# Patient Record
Sex: Female | Born: 1960
Health system: Southern US, Community
[De-identification: ages and names within clinical notes are randomized; demographics above are authoritative.]

## PROBLEM LIST (undated history)

## (undated) DIAGNOSIS — E079 Disorder of thyroid, unspecified: Secondary | ICD-10-CM

## (undated) DIAGNOSIS — F419 Anxiety disorder, unspecified: Secondary | ICD-10-CM

## (undated) DIAGNOSIS — T4145XA Adverse effect of unspecified anesthetic, initial encounter: Secondary | ICD-10-CM

## (undated) DIAGNOSIS — M545 Low back pain, unspecified: Secondary | ICD-10-CM

## (undated) DIAGNOSIS — R011 Cardiac murmur, unspecified: Secondary | ICD-10-CM

## (undated) DIAGNOSIS — J180 Bronchopneumonia, unspecified organism: Secondary | ICD-10-CM

## (undated) DIAGNOSIS — E039 Hypothyroidism, unspecified: Secondary | ICD-10-CM

## (undated) DIAGNOSIS — J42 Unspecified chronic bronchitis: Secondary | ICD-10-CM

## (undated) DIAGNOSIS — M5020 Other cervical disc displacement, unspecified cervical region: Secondary | ICD-10-CM

## (undated) DIAGNOSIS — F410 Panic disorder [episodic paroxysmal anxiety] without agoraphobia: Secondary | ICD-10-CM

## (undated) DIAGNOSIS — R0602 Shortness of breath: Secondary | ICD-10-CM

## (undated) DIAGNOSIS — I10 Essential (primary) hypertension: Secondary | ICD-10-CM

## (undated) DIAGNOSIS — G43909 Migraine, unspecified, not intractable, without status migrainosus: Secondary | ICD-10-CM

## (undated) DIAGNOSIS — J189 Pneumonia, unspecified organism: Secondary | ICD-10-CM

## (undated) DIAGNOSIS — M1990 Unspecified osteoarthritis, unspecified site: Secondary | ICD-10-CM

## (undated) DIAGNOSIS — Z87898 Personal history of other specified conditions: Secondary | ICD-10-CM

## (undated) DIAGNOSIS — G8929 Other chronic pain: Secondary | ICD-10-CM

## (undated) DIAGNOSIS — F329 Major depressive disorder, single episode, unspecified: Secondary | ICD-10-CM

## (undated) DIAGNOSIS — J302 Other seasonal allergic rhinitis: Secondary | ICD-10-CM

## (undated) DIAGNOSIS — F32A Depression, unspecified: Secondary | ICD-10-CM

## (undated) HISTORY — DX: Other cervical disc displacement, unspecified cervical region: M50.20

## (undated) HISTORY — DX: Disorder of thyroid, unspecified: E07.9

## (undated) HISTORY — PX: BACK SURGERY: SHX140

## (undated) HISTORY — DX: Panic disorder (episodic paroxysmal anxiety): F41.0

## (undated) HISTORY — DX: Cardiac murmur, unspecified: R01.1

## (undated) HISTORY — DX: Unspecified osteoarthritis, unspecified site: M19.90

## (undated) HISTORY — DX: Personal history of other specified conditions: Z87.898

---
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
## 1997-08-22 ENCOUNTER — Other Ambulatory Visit: Admission: RE | Admit: 1997-08-22 | Discharge: 1997-08-22 | Payer: Self-pay | Admitting: Gynecology

## 1997-09-06 ENCOUNTER — Other Ambulatory Visit: Admission: RE | Admit: 1997-09-06 | Discharge: 1997-09-06 | Payer: Self-pay | Admitting: Gynecology

## 1997-12-19 ENCOUNTER — Other Ambulatory Visit: Admission: RE | Admit: 1997-12-19 | Discharge: 1997-12-19 | Payer: Self-pay | Admitting: Gynecology

## 1998-05-12 DIAGNOSIS — T8859XA Other complications of anesthesia, initial encounter: Secondary | ICD-10-CM

## 1998-05-12 HISTORY — DX: Other complications of anesthesia, initial encounter: T88.59XA

## 1999-01-30 ENCOUNTER — Other Ambulatory Visit: Admission: RE | Admit: 1999-01-30 | Discharge: 1999-01-30 | Payer: Self-pay | Admitting: Gynecology

## 2004-05-12 DIAGNOSIS — Z87898 Personal history of other specified conditions: Secondary | ICD-10-CM

## 2004-05-12 HISTORY — DX: Personal history of other specified conditions: Z87.898

## 2004-05-12 HISTORY — PX: LIPOSUCTION: SHX10

## 2005-05-30 ENCOUNTER — Other Ambulatory Visit: Admission: RE | Admit: 2005-05-30 | Discharge: 2005-05-30 | Payer: Self-pay | Admitting: Family Medicine

## 2005-06-16 ENCOUNTER — Encounter: Admission: RE | Admit: 2005-06-16 | Discharge: 2005-06-16 | Payer: Self-pay | Admitting: Family Medicine

## 2005-06-30 ENCOUNTER — Inpatient Hospital Stay (HOSPITAL_COMMUNITY): Admission: EM | Admit: 2005-06-30 | Discharge: 2005-07-06 | Payer: Self-pay | Admitting: Emergency Medicine

## 2005-07-11 ENCOUNTER — Ambulatory Visit (HOSPITAL_COMMUNITY): Admission: RE | Admit: 2005-07-11 | Discharge: 2005-07-11 | Payer: Self-pay | Admitting: Family Medicine

## 2006-03-30 ENCOUNTER — Other Ambulatory Visit: Admission: RE | Admit: 2006-03-30 | Discharge: 2006-03-30 | Payer: Self-pay | Admitting: Family Medicine

## 2007-04-12 ENCOUNTER — Other Ambulatory Visit: Admission: RE | Admit: 2007-04-12 | Discharge: 2007-04-12 | Payer: Self-pay | Admitting: Family Medicine

## 2007-07-19 ENCOUNTER — Emergency Department (HOSPITAL_COMMUNITY): Admission: EM | Admit: 2007-07-19 | Discharge: 2007-07-19 | Payer: Self-pay | Admitting: Emergency Medicine

## 2008-04-20 ENCOUNTER — Other Ambulatory Visit: Admission: RE | Admit: 2008-04-20 | Discharge: 2008-04-20 | Payer: Self-pay | Admitting: Family Medicine

## 2008-09-22 ENCOUNTER — Encounter: Admission: RE | Admit: 2008-09-22 | Discharge: 2008-09-22 | Payer: Self-pay | Admitting: Family Medicine

## 2009-10-01 LAB — HM PAP SMEAR

## 2009-10-10 HISTORY — PX: ENDOMETRIAL BIOPSY: SHX622

## 2010-02-09 HISTORY — PX: ABDOMINAL HYSTERECTOMY: SHX81

## 2010-02-26 ENCOUNTER — Ambulatory Visit (HOSPITAL_COMMUNITY): Admission: RE | Admit: 2010-02-26 | Discharge: 2010-02-26 | Payer: Self-pay | Admitting: Obstetrics and Gynecology

## 2010-07-25 LAB — COMPREHENSIVE METABOLIC PANEL
ALT: 15 U/L (ref 0–35)
Alkaline Phosphatase: 67 U/L (ref 39–117)
Creatinine, Ser: 0.61 mg/dL (ref 0.4–1.2)
GFR calc Af Amer: >60 mL/min (ref >60)
Sodium: 140 mEq/L (ref 135–145)
Total Bilirubin: 0.4 mg/dL (ref 0.3–1.2)

## 2010-07-25 LAB — CBC
HCT: 45.5 % (ref 36.0–46.0)
Hemoglobin: 15.7 g/dL — ABNORMAL HIGH (ref 12.0–15.0)
MCHC: 34.5 g/dL (ref 30.0–36.0)
MCV: 86.9 fL (ref 78.0–100.0)
Platelets: 242 10*3/uL (ref 150–400)
RBC: 5.24 MIL/uL — ABNORMAL HIGH (ref 3.87–5.11)
RDW: 12.8 % (ref 11.5–15.5)

## 2010-09-27 NOTE — H&P (Signed)
[name], [name]        ACCOUNT NO.:  [id number]   MEDICAL RECORD NO.:  [id number]          PATIENT TYPE:  EMS   LOCATION:  ED                           FACILITY:  [facility]   PHYSICIAN:  Kela Millin, M.D.DATE OF BIRTH:  1961/02/03   DATE OF ADMISSION:  06/30/2005  DATE OF DISCHARGE:                                HISTORY & PHYSICAL   PRIMARY CARE PHYSICIAN:  Robert L. Foy Guadalajara, M.D.   CHIEF COMPLAINT:  Hypoxia, status post liposuction.   HISTORY OF PRESENT ILLNESS:  The patient is a 50 year old white female with  past medical history significant for asthma, depression, who was intubated  for liposuction today. Following extubation, she was noted to be hypoxic.  She was given Lasix at the office and transferred to [facility] ER for  further evaluation. The patient states that she has had rhinorrhea for the  past 3 weeks or so and she attributed that to her allergies. She also admits  to a cough productive of clear sputum that is unchanged from what she  usually has with her asthma. She denies fever, chest pain, dysuria, nausea,  vomiting, hematemesis, hemoptysis, diarrhea, melena, and no hematemesis.   The patient was seen in the emergency room and a chest x-ray showed patchy  air space densities bilaterally and confluent in the perihilar regions and  in the dependent medial lower lobe. The patient also had a CT angiogram,  which showed bilateral lower lobe infiltrates, negative for pulmonary  emboli. Her white cell count was noted to be 19.6 and her O2 sat on  supplemental oxygen was 96%. She is admitted to the [facility] for further evaluation and management.   PAST MEDICAL HISTORY:  1.  As stated above.  2.  History of migraine headaches.  3.  History of a heart murmur.   MEDICATIONS:  1.  Flonase.  2.  Advair 250/50 b.i.d.  3.  Epinephrine 1:1,000.  4.  Albuterol MDI.  5.  Lexapro 20 mg daily.  6.  Singulair 10 mg daily.  7.  Reglan.  8.   Birth control pill.  9.  Allegra.   ALLERGIES:  BACTRIM.   SOCIAL HISTORY:  She denies tobacco. Occasional alcohol.   FAMILY HISTORY:  Her mother had lung and brain cancer. Her dad had bladder  cancer.   REVIEW OF SYSTEMS:  As per HPI. Other review of systems negative.   PHYSICAL EXAMINATION:  GENERAL:  The patient is a middle aged white female.  She has a head/face band dressing. She is in no respiratory distress with  nasal cannula O2 on.  VITAL SIGNS:  Temperature 96.8, blood pressure 138/82, initially 163/92.  Pulse 112. Respiratory rate 24. O2 sat 96% on nasal cannula O2.  HEENT:  Pupils are equal, round, and reactive to light. Extraocular muscles  intact. Sclerae anicteric. Moist mucous membranes.  LUNGS:  A few basilar crackles, no wheezes.  CARDIOVASCULAR:  Regular, normal S1 and S2.  ABDOMEN:  She has a corset dressing from her extremities up to her abdomen.  Bowel sounds present.  EXTREMITIES:  She has PAS hose as well as a  corset dressing that extends up  to her abdomen. No edema.  NEUROLOGIC:  Alert and oriented. Cranial nerves 2-12 are grossly intact,  nonfocal examination.   LABORATORY DATA:  Chest x-ray:  Patchy air space densities bilaterally,  confluent in the perihilar regions and in the dependent medial lower lobes.   A CT angiogram:  Bilateral lower lobe infiltrates.   White cell count 19.6 with hemoglobin of 13.5 and hematocrit 39.2. Platelet  count 279,000. Neutrophil count 97%, pH 7.32, pCO2 42.5, pO2 73, and the O2  sat is 93.8% and this is on 1 liter O2. Sodium 136, potassium 3.8, chloride  109, CO2 22, glucose 235, BUN 12, creatinine 0.6, calcium 7.6. AST 11, ALT  13, alkaline phosphatase 49, total bilirubin 0.2. Point of care markers  negative x1.   ASSESSMENT/PLAN:  1.  Bilateral infiltrates, probable aspiration pneumonia. As already      discussed, the patient is status post extubation following liposuction      procedure. She has a white cell  count of 19,000. Will start empiric      antibiotics with Zosyn to cover for possible aspirationpneeumonia,      expectorants. Will continue bronchodilators.  2.  Status post liposuction. States she had liposuction to her chin/neck      area, abdomen, and thighs. Discussed patient with plastic surgeon, Dr.      Charolotte Eke and he is to follow patient in the morning.  3.  History of asthma. Continue outpatient medications.  4.  History of depression. Continue Lexapro.      Kela Millin, M.D.  Electronically Signed     ACV/MEDQ  D:  06/30/2005  T:  06/30/2005  Job:  [id number]   cc:   Molly Maduro L. Foy Gu[place]ara, M.D.  Fax: [phone]   L. Doristine Section, M.D.  Fax: [phone]

## 2010-09-27 NOTE — Discharge Summary (Signed)
[name], [name]        ACCOUNT NO.:  [id number]   MEDICAL RECORD NO.:  [id number]          PATIENT TYPE:  INP   LOCATION:  1604                         FACILITY:  [facility]   PHYSICIAN:  Sherin Quarry, MD      DATE OF BIRTH:  Dec 23, 1960   DATE OF ADMISSION:  06/30/2005  DATE OF DISCHARGE:                                 DISCHARGE SUMMARY   [name] is a 50 year old lady with a past history of asthma and  depression who underwent liposuction on February 19. After being extubated  after the procedure, she was noted to be hypoxic and was therefore sent to  the [facility] emergency room for further evaluation. In the emergency  room, a chest x-ray was obtained which showed patchy airspace densities  bilaterally which appeared to be confluent in perihilar region. A CT  utilizing PE protocol was done which had the same finding. It was negative  for pulmonary emboli. White count was 19,600. It was suspected that possibly  the patient had experienced an aspiration event. Another possible  explanation that was entertained was fat emboli.   PHYSICAL EXAMINATION:  Physical exam at time of admission as described by  Dr. Kela Millin.  VITAL SIGNS:  Temperature is 96.8, blood pressure 138/82, pulse was 112,  respirations 24, O2 saturation was 96% on 2 liters by nasal cannula.  HEENT:  Exam was within normal limits.  CHEST:  The chest revealed a few bibasilar crackles.  CARDIOVASCULAR:  Normal S1, S2 without rubs, murmurs or gallops.  ABDOMEN:  The abdomen was benign. The patient had a corset dressing in  place. There was no guarding or rebound.  NEUROLOGIC TESTING:  The patient was alert and oriented. Cranial nerves,  motor, sensory and cerebellar testing was normal.  EXTREMITIES:  Examination of extremities revealed no evidence of cyanosis or  edema.   LABORATORY DATA:  Relevant laboratory studies obtained included serial  cardiac enzymes which were negative. Blood  cultures were negative. BNP was  less then 30. Liver profile was normal. Sodium was 136, potassium 3.8. The  patient's glucose was initially noted to be 235. Subsequently her glucose  was monitored and was within the normal range. Creatinine was 0.6, BUN was  12. White count was 14,300 as mentioned, hemoglobin was 11.5. On admission  Dr. Suanne Marker who placed the patient on a clear liquid diet. She was placed on  IV Zosyn 3.375 grams every 6 hours. Dilaudid was administered for pain. She  was continued on her Advair therapy as well as albuterol metered dose  inhaler, Singulair 10 mg daily was also continued. The patient's course was  one of slow gradual improvement. Her temperature was 100 on February 22.  Temperature curve showed gradual defervescence after that point. She was  advanced to a regular diet. Her oxygen saturation on room air gradually came  up to the point where it was 96% on February 25. She was observed to be able  to ambulate without difficulty and had only a mild dry cough. Dr.  Charolotte Eke followed the patient regularly in the hospital and expressed  satisfaction with her postoperative recovery.  By February 25, the patient  was feeling absolutely fine, she was no longer on oxygen. She had only a  mild dry cough. She did not complain of chest pain and was able to ambulate  up and down the halls without any difficulty. Therefore she was discharged.   DISCHARGE DIAGNOSES:  1.  Acute onset of bilateral pulmonary infiltrates possibly secondary to      aspiration.  2.  Status post liposuction procedure.  3.  History of asthma.  4.  History of depression.  5.  History of migraine headaches.   The patient was advised to continue her usual medicines. These apparently  consist of :  1.  Flonase p.r.n.  2.  Advair 50/250 1 puff b.i.d.  3.  Albuterol meter dose inhaler 3 puffs q.i.d.  4.  Lexapro 20 mg daily.  5.  Singulair 10 mg daily.  6.  Reglan p.r.n.  7.  Birth control  pill.  8.  Allegra also p.r.n.   In addition of these medicines, she is instructed to continue Avelox for 4  additional days to complete a total of 10 days of antibiotic therapy. As per  Dr. Neena Rhymes instructions, she is also advised to take an iron tablet  twice daily which he supplied her previously. She will be given Vicodin  5/500 with instructions take 1-2 every 6 hours p.r.n. for pain. She was  advised to call Dr. Pablo Lawrence office on Monday to arrange a return visit  possibly Thursday or Friday. She will eventually need to have a chest x-ray  to confirm resolution of the pulmonary infiltrates. This should probably be  done in about 4-6 weeks. She will also follow-up with Dr. Charolotte Eke on  Wednesday.           ______________________________  Sherin Quarry, MD     SY/MEDQ  D:  07/06/2005  T:  07/07/2005  Job:  [id number]   cc:   Molly Maduro L. Foy [place], M.D.  Fax: [phone]   L. Doristine Section, M.D.  Fax: [phone]

## 2011-05-04 ENCOUNTER — Emergency Department (INDEPENDENT_AMBULATORY_CARE_PROVIDER_SITE_OTHER): Payer: 59

## 2011-05-04 ENCOUNTER — Emergency Department (HOSPITAL_BASED_OUTPATIENT_CLINIC_OR_DEPARTMENT_OTHER)
Admission: EM | Admit: 2011-05-04 | Discharge: 2011-05-04 | Disposition: A | Discharge status: Home / Self Care | Payer: 59 | Attending: Emergency Medicine | Admitting: Emergency Medicine

## 2011-05-04 ENCOUNTER — Encounter: Payer: Self-pay | Admitting: *Deleted

## 2011-05-04 DIAGNOSIS — I10 Essential (primary) hypertension: Secondary | ICD-10-CM | POA: Insufficient documentation

## 2011-05-04 DIAGNOSIS — R509 Fever, unspecified: Secondary | ICD-10-CM

## 2011-05-04 DIAGNOSIS — Z79899 Other long term (current) drug therapy: Secondary | ICD-10-CM | POA: Insufficient documentation

## 2011-05-04 DIAGNOSIS — R112 Nausea with vomiting, unspecified: Secondary | ICD-10-CM

## 2011-05-04 DIAGNOSIS — R059 Cough, unspecified: Secondary | ICD-10-CM

## 2011-05-04 DIAGNOSIS — J45909 Unspecified asthma, uncomplicated: Secondary | ICD-10-CM | POA: Insufficient documentation

## 2011-05-04 DIAGNOSIS — R05 Cough: Secondary | ICD-10-CM

## 2011-05-04 HISTORY — DX: Essential (primary) hypertension: I10

## 2011-05-04 HISTORY — DX: Other seasonal allergic rhinitis: J30.2

## 2011-05-04 HISTORY — DX: Depression, unspecified: F32.A

## 2011-05-04 HISTORY — DX: Anxiety disorder, unspecified: F41.9

## 2011-05-04 HISTORY — DX: Major depressive disorder, single episode, unspecified: F32.9

## 2011-05-04 LAB — RAPID STREP SCREEN (MED CTR MEBANE ONLY): Streptococcus, Group A Screen (Direct): NEGATIVE

## 2011-05-04 MED ORDER — HYDROCODONE-ACETAMINOPHEN 5-325 MG PO TABS
ORAL_TABLET | ORAL | Status: AC
  Administered 2011-05-04: 2 via ORAL
  Filled 2011-05-04: qty 2

## 2011-05-04 MED ORDER — HYDROCODONE-ACETAMINOPHEN 5-325 MG PO TABS
2.0000 | ORAL_TABLET | Freq: Once | ORAL | Status: AC
  Administered 2011-05-04: 2 via ORAL

## 2011-05-04 MED ORDER — IBUPROFEN 800 MG PO TABS
800.0000 mg | ORAL_TABLET | Freq: Once | ORAL | Status: AC
  Administered 2011-05-04: 800 mg via ORAL
  Filled 2011-05-04: qty 1

## 2011-05-04 MED ORDER — HYDROCODONE-ACETAMINOPHEN 5-325 MG PO TABS: 2.0000 | ORAL_TABLET | ORAL | Status: AC | PRN

## 2011-05-04 MED ORDER — IBUPROFEN 800 MG PO TABS
ORAL_TABLET | ORAL | Status: AC
  Filled 2011-05-04: qty 1

## 2011-05-04 NOTE — ED Provider Notes (Signed)
History   This chart was scribed for Doug Sou, MD by Sofie Rower. The patient was seen in room MH07/MH07 and the patient's care was started at 5:30PM .    CSN: [id number]  Arrival date & time 05/04/11  1512   First MD Initiated Contact with Patient 05/04/11 1724      Chief Complaint  Patient presents with  . Influenza    (Consider location/radiation/quality/duration/timing/severity/associated sxs/prior treatment) HPI  [name] is a 50 y.o. female who presents to the Emergency Department complaining of moderate, constant cough onset five days ago with associated symptoms of myalgia, sore throat, vomiting, and loss of sleep. . Pt. Has had sick contacts at home and bronchitis a month ago. Pt. Has a history of asthma and arthritis. Pt. Has been treating symptoms at home with day-quil and alevePt also complains of difficulty breathing. Pt denies smoking and illegal drug use.  Past Medical History  Diagnosis Date  . Asthma   . Hypertension   . Anxiety   . Depression   . Seasonal allergies     Past Surgical History  Procedure Date  . Abdominal hysterectomy     No family history on file.  History  Substance Use Topics  . Smoking status: Never Smoker   . Smokeless tobacco: Not on file  . Alcohol Use: Yes    OB History    Grav Para Term Preterm Abortions TAB SAB Ect Mult Living                  Review of Systems  10 Systems reviewed and are negative for acute change except as noted in the HPI.   Allergies  Bactrim and Other  Home Medications   Current Outpatient Rx  Name Route Sig Dispense Refill  . BISOPROLOL FUMARATE 5 MG PO TABS Oral Take 5 mg by mouth daily.      . BUDESONIDE-FORMOTEROL FUMARATE 160-4.5 MCG/ACT IN AERO Inhalation Inhale 2 puffs into the lungs 2 (two) times daily.      Marland Kitchen CETIRIZINE HCL 10 MG PO TABS Oral Take 10 mg by mouth daily.      . DULOXETINE HCL 60 MG PO CPEP Oral Take 60 mg by mouth daily.      Marland Kitchen LEVOTHYROXINE  SODIUM 75 MCG PO TABS Oral Take 75 mcg by mouth daily.      Marland Kitchen MENTHOL 3 MG MT LOZG Oral Take 1 lozenge by mouth as needed.      Marland Kitchen MINOCYCLINE HCL ER 80 MG PO TB24 Oral Take 80 mg by mouth daily.      . MOMETASONE FUROATE 50 MCG/ACT NA SUSP Nasal Place 1 spray into the nose daily.     . OSELTAMIVIR PHOSPHATE 75 MG PO CAPS Oral Take 75 mg by mouth 2 (two) times daily.      Marland Kitchen PROBIOTIC FORMULA PO CAPS Oral Take 1 capsule by mouth daily.      . TRAZODONE HCL 50 MG PO TABS Oral Take 50-100 mg by mouth at bedtime.        BP 115/84  Pulse 86  Temp(Src) 100.8 F (38.2 C) (Oral)  Resp 20  Ht 5\' 11"  (1.803 m)  Wt 205 lb (92.987 kg)  BMI 28.59 kg/m2  SpO2 95%  Physical Exam  Nursing note and vitals reviewed. Constitutional: She is oriented to person, place, and time. She appears well-developed and well-nourished. No distress.  HENT:  Head: Normocephalic and atraumatic.       Oropharynx red.  Eyes: EOM are normal. Pupils are equal, round, and reactive to light.  Neck: Neck supple. No tracheal deviation present.  Cardiovascular: Normal rate and regular rhythm.   Pulmonary/Chest: Effort normal. No respiratory distress.  Abdominal: Soft. She exhibits no distension.  Musculoskeletal: Normal range of motion. She exhibits no edema.  Neurological: She is alert and oriented to person, place, and time. No sensory deficit.  Skin: Skin is warm and dry.  Psychiatric: She has a normal mood and affect. Her behavior is normal.    ED Course  Procedures (including critical care time)  DIAGNOSTIC STUDIES: Oxygen Saturation is 95% on room air, normal by my interpretation.    COORDINATION OF CARE:    Results for orders placed during the hospital encounter of 05/04/11  RAPID STREP SCREEN      Component Value Range   Streptococcus, Group A Screen (Direct) NEGATIVE  NEGATIVE    Dg Chest 2 View  05/04/2011  *RADIOLOGY REPORT*  Clinical Data: Cough, nausea and vomiting.  CHEST - 2 VIEW  Comparison:  Plain film and CT chest 06/30/2005.  Findings: The lungs are clear.  Heart size is normal.  No pneumothorax or pleural effusion.  No focal bony abnormality.  IMPRESSION: No acute disease.  Original Report Authenticated By: Bernadene Bell. D'ALESSIO, M.D.         MDM  6:45 PM feels much improved after treatment with hydrocodone-A. Pap patient alert talkative no distress. Symptoms consistent with viral illness. Plan prescription hydrocodone-A. Pap followup PMD if not better 4 or 5 days  5:40PM- EDP at bedside discusses treatment plan. Diagnosis febrile illness  I personally performed the services described in this documentation, which was scribed in my presence. The recorded information has been reviewed and considered.      Doug Sou, MD 05/04/11 [phone]

## 2011-05-04 NOTE — ED Notes (Signed)
Pt states her son had the flu and she thought she was getting a "cold" on Tuesday. Started Tamiflu on Friday. Fever all week. Cough, congestion to the point of blood tinged sputum.

## 2011-05-04 NOTE — ED Notes (Signed)
Care and hydration safe use of meds reviewed

## 2012-03-02 LAB — HM MAMMOGRAPHY

## 2012-05-12 DIAGNOSIS — J189 Pneumonia, unspecified organism: Secondary | ICD-10-CM

## 2012-05-12 HISTORY — DX: Pneumonia, unspecified organism: J18.9

## 2013-02-04 ENCOUNTER — Ambulatory Visit: Payer: Self-pay | Admitting: Obstetrics and Gynecology

## 2013-02-07 ENCOUNTER — Ambulatory Visit (INDEPENDENT_AMBULATORY_CARE_PROVIDER_SITE_OTHER): Payer: BC Managed Care – PPO | Admitting: Obstetrics and Gynecology

## 2013-02-07 ENCOUNTER — Telehealth: Payer: Self-pay | Admitting: Obstetrics and Gynecology

## 2013-02-07 ENCOUNTER — Encounter: Payer: Self-pay | Admitting: Obstetrics and Gynecology

## 2013-02-07 VITALS — BP 122/80 | HR 70 | Ht 69.5 in | Wt 185.5 lb

## 2013-02-07 DIAGNOSIS — N393 Stress incontinence (female) (male): Secondary | ICD-10-CM

## 2013-02-07 DIAGNOSIS — Z0000 Encounter for general adult medical examination without abnormal findings: Secondary | ICD-10-CM

## 2013-02-07 DIAGNOSIS — Z1211 Encounter for screening for malignant neoplasm of colon: Secondary | ICD-10-CM

## 2013-02-07 DIAGNOSIS — N812 Incomplete uterovaginal prolapse: Secondary | ICD-10-CM

## 2013-02-07 DIAGNOSIS — Z01419 Encounter for gynecological examination (general) (routine) without abnormal findings: Secondary | ICD-10-CM

## 2013-02-07 LAB — POCT URINALYSIS DIPSTICK
Glucose, UA: NEGATIVE
Leukocytes, UA: NEGATIVE
Nitrite, UA: NEGATIVE
Protein, UA: NEGATIVE
Urobilinogen, UA: NEGATIVE

## 2013-02-07 NOTE — Patient Instructions (Signed)

## 2013-02-07 NOTE — Telephone Encounter (Signed)
LMTCB to discuss ins benefits and schedule Urodynamic testing.

## 2013-02-07 NOTE — Progress Notes (Signed)
Patient ID: [name], female   DOB: 10/31/1960, 52 y.o.   MRN: [id number] GYNECOLOGY VISIT  PCP:  Marinda Elk, MD  Referring provider:   HPI: 52 y.o.   Married  Caucasian  female   No obstetric history on file. with No LMP recorded. Patient has had a hysterectomy.   here for   AEX. Patient having urinary incontinence. Has asthma and so coughing a lot and leaks then. Leaks with sex, laughing, exercise.  Does not leak for no reason at all. NF - depends, can be up to every 30 minutes if drinking a lot of fluids. DF - twice No bladder surgery.  Started incontinence after childbirth.  Largest was 9# 1 ounces.  Wants to pursue treatment.   Having hot flashes and night sweats.  Discomfort with intercourse.  Uses lubricant.   Wearing a pad.  Hgb:  PCP Urine:  Neg  GYNECOLOGIC HISTORY: No LMP recorded. Patient has had a hysterectomy. Sexually active:  yes Partner preference: female Contraception:  hysterectomy Menopausal hormone therapy:  DES exposure:   no Blood transfusions:   no Sexually transmitted diseases:   no GYN Procedures:  Robotic-[facility]--still has ovaries Mammogram:    09/2012 wnl: [facility]             Pap:  10-01-09 wnl History of abnormal pap smear:  Yes, 2006.   OB History   Grav Para Term Preterm Abortions TAB SAB Ect Mult Living                   LIFESTYLE: Exercise:   walking            Tobacco:   no Alcohol:      4 glasses of wine per week Drug use:   no  OTHER HEALTH MAINTENANCE: Tetanus/TDap:  09/2009 Gardisil:  NA Influenza:  01/2012 Zostavax:  NA  Bone density: n/a Colonoscopy: never  Cholesterol check: last week with PCP  Family History  Problem Relation Age of Onset  . Brain cancer Mother     dec with brain & lung CA  . Diabetes Maternal Grandmother   . Diabetes Maternal Grandfather   . Diabetes Paternal Grandmother   . Diabetes Paternal Grandfather   . Bladder Cancer Father     dec age 68     There are no active problems to display for this patient.  Past Medical History  Diagnosis Date  . Asthma   . Hypertension   . Anxiety   . Depression   . Seasonal allergies   . Thyroid disease     hypothyroidism  . Hx of abnormal Pap smear 2006    no treatment to cervix    Past Surgical History  Procedure Laterality Date  . Abdominal hysterectomy  02/2010    Robotic-[facility]--Dr. Tresa Res  . Endometrial biopsy  10-10-09    Benign fragments of endometrial polyp  . Inguinal hernia repair      as an infant  . Liposuction  2006    thighs, chin    ALLERGIES: Bactrim and Other  Current Outpatient Prescriptions  Medication Sig Dispense Refill  . bisoprolol (ZEBETA) 5 MG tablet Take 5 mg by mouth daily.        . budesonide-formoterol (SYMBICORT) 160-4.5 MCG/ACT inhaler Inhale 2 puffs into the lungs 2 (two) times daily.        . cetirizine (ZYRTEC) 10 MG tablet Take 10 mg by mouth daily.        Marland Kitchen  clonazePAM (KLONOPIN) 0.5 MG tablet Take 0.5 mg by mouth as needed for anxiety.      . DULoxetine (CYMBALTA) 60 MG capsule Take 60 mg by mouth daily.        Marland Kitchen levothyroxine (SYNTHROID, LEVOTHROID) 75 MCG tablet Take 75 mcg by mouth daily.        Marland Kitchen menthol-cetylpyridinium (CEPACOL) 3 MG lozenge Take 1 lozenge by mouth as needed.        . Minocycline HCl (SOLODYN) 80 MG TB24 Take 80 mg by mouth daily.        . mometasone (NASONEX) 50 MCG/ACT nasal spray Place 1 spray into the nose daily.       . montelukast (SINGULAIR) 10 MG tablet Take 10 mg by mouth at bedtime.      . Multiple Vitamin (MULTIVITAMIN) capsule Take 1 capsule by mouth daily.      . Probiotic Product (PROBIOTIC FORMULA) CAPS Take 1 capsule by mouth daily.        . traZODone (DESYREL) 50 MG tablet Take 50-100 mg by mouth at bedtime.         No current facility-administered medications for this visit.     ROS:  Pertinent items are noted in HPI.  SOCIAL HISTORY:   Just remarried and patient is very happy.   Husband is 35 years  old.   Patient is a stay at home mom.   PHYSICAL EXAMINATION:    BP 122/80  Pulse 70  Ht 5' 9.5" (1.765 m)  Wt 185 lb 8 oz (84.142 kg)  BMI 27.01 kg/m2   Wt Readings from Last 3 Encounters:  02/07/13 185 lb 8 oz (84.142 kg)  05/04/11 205 lb (92.987 kg)     Ht Readings from Last 3 Encounters:  02/07/13 5' 9.5" (1.765 m)  05/04/11 5\' 11"  (1.803 m)    General appearance: alert, cooperative and appears stated age Head: Normocephalic, without obvious abnormality, atraumatic Neck: no adenopathy, supple, symmetrical, trachea midline and thyroid not enlarged, symmetric, no tenderness/mass/nodules Lungs: clear to auscultation bilaterally Breasts: Right breast with superficial bruise at 4 o'clock.  No induration.  (States was part of recent sexual activity.)  No nipple retraction or dimpling, No nipple discharge or bleeding, No axillary or supraclavicular adenopathy, Normal to palpation without dominant masses Heart: regular rate and rhythm Abdomen: soft, non-tender; no masses,  no organomegaly Extremities: extremities normal, atraumatic, no cyanosis or edema Skin: Skin color, texture, turgor normal. No rashes or lesions Lymph nodes: Cervical, supraclavicular, and axillary nodes normal. No abnormal inguinal nodes palpated Neurologic: Grossly normal  Pelvic: External genitalia:  no lesions              Urethra:  normal appearing urethra with no masses, tenderness or lesions              Bartholins and Skenes: normal                 Vagina: normal appearing vagina with normal color and discharge, no lesions.  First degree cystocele and first degree rectocele.   Good apical support.               Cervix: absent              Pap and high risk HPV testing done: no.            Bimanual Exam:  Uterus:   absent  Adnexa: no masses                                      Rectovaginal: Confirms                                      Anus:  normal sphincter tone, no  lesions  ASSESSMENT  Incomplete vaginal prolapse. Genuine stress incontinence.  Menopausal symptoms.  Declines ERT.  PLAN  Mammogram yearly.  Patient chooses to do with [facility].  Pap smear and high risk HPV testing - not performed.  Counseled on urinary incontinence and prolapse.  ACOG handouts on both of these as well as surgical treatments of the above. Return for urodynamic studies.  Return annually or prn   An After Visit Summary was printed and given to the patient.

## 2013-02-10 NOTE — Telephone Encounter (Signed)
Patient scheduled for 10/15 for urodynamic testing.

## 2013-02-15 ENCOUNTER — Telehealth: Payer: Self-pay | Admitting: Obstetrics and Gynecology

## 2013-02-15 ENCOUNTER — Encounter: Payer: Self-pay | Admitting: Internal Medicine

## 2013-02-15 ENCOUNTER — Other Ambulatory Visit: Payer: Self-pay | Admitting: Obstetrics and Gynecology

## 2013-02-15 DIAGNOSIS — Z1273 Encounter for screening for malignant neoplasm of ovary: Secondary | ICD-10-CM

## 2013-02-15 NOTE — Telephone Encounter (Signed)
Pt would like to speak with nurse about getting an ultrasound appointmnet. She has some concerns about cancer.

## 2013-02-15 NOTE — Telephone Encounter (Signed)
Dr. Edward Jolly,  Spoke with patient. She is requesting an ultrasound to r/o ovarian cancer. Her best friend has been diagnosed and experienced same symptoms that patient has with bladder "dropping" and concerns from prior to hysterectomy. Denies any new symptoms. Would like to have U/S done here in office if possible.

## 2013-02-15 NOTE — Telephone Encounter (Signed)
I will order an in office pelvic ultrasound to evaluate her ovaries.

## 2013-02-16 NOTE — Telephone Encounter (Addendum)
Thank you Dr. Edward Jolly, patient is wondering how long does it usually take to get Urodynamic testing results back? PUS scheduled for the the day after  Urodynamics and was wondering if results would be back by then.

## 2013-02-16 NOTE — Telephone Encounter (Signed)
Hi Tracy,  I should be able to review the urodynamics when patient comes in for her ultrasound appointment.

## 2013-02-17 ENCOUNTER — Telehealth: Payer: Self-pay | Admitting: Obstetrics and Gynecology

## 2013-02-17 NOTE — Telephone Encounter (Signed)
Patient given message from Dr. Edward Jolly. Agreeable to plan.

## 2013-02-17 NOTE — Telephone Encounter (Signed)
Call to patient, LVM, advised patient will be responsible for a $25 copay for her PUS.

## 2013-02-21 ENCOUNTER — Ambulatory Visit (INDEPENDENT_AMBULATORY_CARE_PROVIDER_SITE_OTHER): Payer: BC Managed Care – PPO

## 2013-02-21 VITALS — BP 118/82 | HR 60 | Resp 12 | Ht 69.5 in | Wt 186.0 lb

## 2013-02-21 DIAGNOSIS — R32 Unspecified urinary incontinence: Secondary | ICD-10-CM

## 2013-02-21 LAB — POCT URINALYSIS DIPSTICK
Nitrite, UA: NEGATIVE
Urobilinogen, UA: NEGATIVE
pH, UA: 7

## 2013-02-22 ENCOUNTER — Telehealth: Payer: Self-pay | Admitting: *Deleted

## 2013-02-22 ENCOUNTER — Other Ambulatory Visit: Payer: Self-pay | Admitting: Obstetrics and Gynecology

## 2013-02-22 LAB — URINALYSIS, MICROSCOPIC ONLY

## 2013-02-22 MED ORDER — CIPROFLOXACIN HCL 500 MG PO TABS: 500.0000 mg | ORAL_TABLET | Freq: Two times a day (BID) | ORAL | Status: DC

## 2013-02-22 NOTE — Telephone Encounter (Signed)
Patient returned call, notified of Dr Rica Records instruction. TOC scheduled for 03-07-13 and urodynamics rescheduled for 03-09-13, patient is aware this is pending the TOC. Patient will keep appt for PUS scheduled for this Thursday and is anxious to proceed with scheduling surgery for this year.

## 2013-02-22 NOTE — Telephone Encounter (Signed)
Message copied by Alisa Graff on Tue Feb 22, 2013  2:06 PM ------      Message from: Conley Simmonds      Created: Tue Feb 22, 2013  6:52 AM       Kennon Rounds,            This patient needs to have a urine culture performed from her urine specimen from yesterday.      Please begin treatment with Ciprofloxacin 500 mg po bid for 7 days.  I will sent Rx in through Epic.            Contact the patient regarding this and cancel her urodynamics testing.            She will need a test of cure in 10 days, and we can then see about doing her uro studies.             Thanks,            [employer] ------

## 2013-02-22 NOTE — Telephone Encounter (Signed)
Message copied by Alisa Graff on Tue Feb 22, 2013 12:42 PM ------      Message from: Conley Simmonds      Created: Tue Feb 22, 2013  6:52 AM       Kennon Rounds,            This patient needs to have a urine culture performed from her urine specimen from yesterday.      Please begin treatment with Ciprofloxacin 500 mg po bid for 7 days.  I will sent Rx in through Epic.            Contact the patient regarding this and cancel her urodynamics testing.            She will need a test of cure in 10 days, and we can then see about doing her uro studies.             Thanks,            [employer] ------

## 2013-02-22 NOTE — Telephone Encounter (Signed)
Thank you for your assistance in coordinating this patient's care.

## 2013-02-22 NOTE — Telephone Encounter (Signed)
Call to patient with results, VM has number has confirmation. LM to call regarding results and appt for tomm/ LMTCB.

## 2013-02-23 ENCOUNTER — Ambulatory Visit: Payer: BC Managed Care – PPO

## 2013-02-23 LAB — URINE CULTURE: Colony Count: 40000

## 2013-02-24 ENCOUNTER — Ambulatory Visit (INDEPENDENT_AMBULATORY_CARE_PROVIDER_SITE_OTHER): Payer: BC Managed Care – PPO | Admitting: Obstetrics and Gynecology

## 2013-02-24 ENCOUNTER — Encounter: Payer: Self-pay | Admitting: Obstetrics and Gynecology

## 2013-02-24 ENCOUNTER — Ambulatory Visit (INDEPENDENT_AMBULATORY_CARE_PROVIDER_SITE_OTHER): Payer: BC Managed Care – PPO

## 2013-02-24 VITALS — BP 122/78 | HR 70 | Ht 69.5 in | Wt 190.5 lb

## 2013-02-24 DIAGNOSIS — N83202 Unspecified ovarian cyst, left side: Secondary | ICD-10-CM

## 2013-02-24 DIAGNOSIS — Z1273 Encounter for screening for malignant neoplasm of ovary: Secondary | ICD-10-CM

## 2013-02-24 DIAGNOSIS — N951 Menopausal and female climacteric states: Secondary | ICD-10-CM

## 2013-02-24 DIAGNOSIS — N83209 Unspecified ovarian cyst, unspecified side: Secondary | ICD-10-CM

## 2013-02-24 NOTE — Progress Notes (Signed)
This is the corrected ultrasound report.

## 2013-02-24 NOTE — Progress Notes (Signed)
Subjective  Patient is here for a pelvic ultrasound.  Wants ovarian evaluation.  Friend diagnosed with ovarian cancer.  Status post robotic hysterectomy in 2011. Some LLQ pain with intercourse. Night sweats and hot flashes.  Currently on Ciprofloxacin for positive UA which grew out mixed colonies 40,000.  Patient is rescheduled for urodynamic testing for stress incontinence. Has a repeat urine culture for 10/27 and then her urodynamics on 10/29.  Mammogram in November in [place].  Colonoscopy December 22.  Objective  Pelvic ultrasound shows a complex left ovarian cyst 2.7 cm with septations, doppler upper limit of normal.  Suspect hemorrhagic ovarian cyst.  Normal right ovary.  Absent uterus.   Assessment  Complex left adnexal mass. Pelvic organ prolapse. Mixed colonies on urine culture.  On Ciprofloxacin. Menopausal symptoms  Plan  Check CA125, FSH, and estradiol. Anticipate repeat ultrasound in 5 - 6 weeks. Return for repeat urine culture on 10/27. Urodynamics on 10/29. OK to continue Cipro.

## 2013-02-25 NOTE — Telephone Encounter (Signed)
Pt has a question regarding her lab appt on Monday.

## 2013-02-25 NOTE — Telephone Encounter (Addendum)
Patient was calling to ask if she could move her test of cure appointment to Tuesday. Advised she needs a urine culture and culture will need time to grow. Patient agreeable and will keep her Monday appointment for Lab.   Routing to provider for review.  Will close encounter.

## 2013-03-01 ENCOUNTER — Other Ambulatory Visit: Payer: Self-pay | Admitting: Orthopedic Surgery

## 2013-03-01 ENCOUNTER — Telehealth: Payer: Self-pay | Admitting: Obstetrics and Gynecology

## 2013-03-01 NOTE — Telephone Encounter (Signed)
Routing to Dr. Edward Jolly for Lor[facility] Childes, appointment scheduled. Will close encounter.

## 2013-03-01 NOTE — Telephone Encounter (Signed)
U/S and consult scheduled for patient for 12/4.  Carolynn, routing to you for precert.

## 2013-03-01 NOTE — Telephone Encounter (Signed)
Patient is calling saying she needed to schedule a f/u appt for another pus. Said silva wanted there to come the week of thanksgiving.

## 2013-03-02 ENCOUNTER — Institutional Professional Consult (permissible substitution): Payer: BC Managed Care – PPO | Admitting: Obstetrics and Gynecology

## 2013-03-04 DIAGNOSIS — F410 Panic disorder [episodic paroxysmal anxiety] without agoraphobia: Secondary | ICD-10-CM

## 2013-03-04 HISTORY — DX: Panic disorder (episodic paroxysmal anxiety): F41.0

## 2013-03-07 ENCOUNTER — Other Ambulatory Visit: Payer: Self-pay

## 2013-03-07 ENCOUNTER — Ambulatory Visit (INDEPENDENT_AMBULATORY_CARE_PROVIDER_SITE_OTHER): Payer: BC Managed Care – PPO

## 2013-03-07 VITALS — BP 104/74 | HR 68 | Resp 16 | Ht 69.5 in | Wt 187.0 lb

## 2013-03-07 DIAGNOSIS — N390 Urinary tract infection, site not specified: Secondary | ICD-10-CM

## 2013-03-07 LAB — POCT URINALYSIS DIPSTICK
Bilirubin, UA: NEGATIVE
Blood, UA: NEGATIVE
Leukocytes, UA: NEGATIVE
Nitrite, UA: NEGATIVE
Urobilinogen, UA: NEGATIVE
pH, UA: 5

## 2013-03-07 NOTE — Progress Notes (Signed)
Pt wanted to let you know that she had a bad anxiety attack on Friday night & is going to see a psychiatrist. Pt also stated that when she took her last cipro pill that she developed sob & blisters on her throat. Urine sent for culture.  Pt states that she has urodynamics appt here this wednesday

## 2013-03-07 NOTE — Addendum Note (Signed)
Addended by: Eliezer Bottom on: 03/07/2013 03:52 PM   Modules accepted: Orders

## 2013-03-08 LAB — URINE CULTURE
Colony Count: NO GROWTH
Organism ID, Bacteria: NO GROWTH

## 2013-03-09 ENCOUNTER — Ambulatory Visit (INDEPENDENT_AMBULATORY_CARE_PROVIDER_SITE_OTHER): Payer: BC Managed Care – PPO | Admitting: *Deleted

## 2013-03-09 ENCOUNTER — Encounter: Payer: Self-pay | Admitting: Internal Medicine

## 2013-03-09 VITALS — BP 122/80 | HR 76 | Resp 18 | Wt 187.0 lb

## 2013-03-09 DIAGNOSIS — N393 Stress incontinence (female) (male): Secondary | ICD-10-CM

## 2013-03-09 NOTE — Progress Notes (Signed)
Here today for Urodynamic testing, Pt tolerated well. To return 03/14/13 for consult to discuss results.

## 2013-03-11 ENCOUNTER — Telehealth: Payer: Self-pay | Admitting: Obstetrics and Gynecology

## 2013-03-11 NOTE — Telephone Encounter (Signed)
Patient very interested in scheduling for week of Thanksgiving if that is an option.  She states she did not get the RX that Dr Edward Jolly mentioned for urinary symptoms. She denies any problems since the procedure and doesn't feel she needs any medication right now. Advised that the medication Dr Edward Jolly mentioned is actually over the counter medication, AZO, or store brand of similar product which may help with any burning or frequency symptoms that she has been experiencing due to prolapse.  Has follow up appointment with Dr Edward Jolly on Mon.    Routing to provider for review, patient agreeable to disposition. Encounter closed.

## 2013-03-11 NOTE — Telephone Encounter (Signed)
Patient has some questions about surgery. And was supposed to get prescription for stinging when urinating but nothing was ever called in. Wants to talk with sally.

## 2013-03-14 ENCOUNTER — Encounter: Payer: Self-pay | Admitting: Obstetrics and Gynecology

## 2013-03-14 ENCOUNTER — Ambulatory Visit (INDEPENDENT_AMBULATORY_CARE_PROVIDER_SITE_OTHER): Payer: BC Managed Care – PPO | Admitting: Obstetrics and Gynecology

## 2013-03-14 VITALS — BP 140/80 | HR 70 | Ht 69.5 in | Wt 190.0 lb

## 2013-03-14 DIAGNOSIS — N83209 Unspecified ovarian cyst, unspecified side: Secondary | ICD-10-CM

## 2013-03-14 DIAGNOSIS — N812 Incomplete uterovaginal prolapse: Secondary | ICD-10-CM

## 2013-03-14 DIAGNOSIS — N83202 Unspecified ovarian cyst, left side: Secondary | ICD-10-CM

## 2013-03-14 DIAGNOSIS — N393 Stress incontinence (female) (male): Secondary | ICD-10-CM

## 2013-03-14 NOTE — Patient Instructions (Signed)
We will call to reschedule your ultrasound.

## 2013-03-16 ENCOUNTER — Telehealth: Payer: Self-pay | Admitting: *Deleted

## 2013-03-16 NOTE — Telephone Encounter (Signed)
Surgery insructions reviewed with patient and written copy mailed. Pre/post op appointments scheduled. PUS 03-24-13 Surgery consult 03-30-13 [name] will call her with insurance information.  Routing to provider for final review. Patient agreeable to disposition. Will close encounter

## 2013-03-16 NOTE — Telephone Encounter (Signed)
Call to patient to reschedule ultrasound appointment and discuss surgery.  Patient unable to talk now but will call back with her schedule.

## 2013-03-16 NOTE — Telephone Encounter (Signed)
Returning a call to Sally. °

## 2013-03-16 NOTE — Telephone Encounter (Signed)
Call back to patient, LMTCB. 

## 2013-03-16 NOTE — Progress Notes (Signed)
Patient ID: [name], female   DOB: 1961/02/13, 52 y.o.   MRN: [id number]  Subjective  Patient and her husband are here today to review urodynamic testing.  Patient is also scheduled for a pelvic ultrasound to do a recheck of a complex left ovarian mass noted on pelvic ultrasound on 02/24/13.  Her ultrasound was scheduled for December and her surgery was scheduled for the week of Thanksgiving.  Patient also shares her history of asthma and a history of aspiration pneumonia post op following a prior plastic surgery at a surgery center. Patient does state increased asthma symptoms and she is in consultation with her PCP regarding this.    Objective  BP 140/80      P 70   Urodynamic testing  Uroflow - Continuous pattern.  Void 45 cc.  PVR 0. CMG - Valsalva LPP 64 cm H20.  Stable CMG.  Sensation - early sensation - first, second.  Max capacity 387 cc.  Presure flow - Voided volume 452 cc.  Pdet Max 42 cm H20.  Continuous pattern. UPP - 44 cm H20.  Assessment  Cystocele. Rectocele. Genuine stress incontinence. Complex left adnexal mass.  Plan  Patient will need to have her ultrasound moved up so I can determine if she will need a laparoscopic salpingo-oophorectomy at the time of her prolapse repair.   We will plan to proceed with an anterior and posterior colporrhaphy with native tissue repair and a TVT Exact midurethral sling.  Benefits and risks and alternatives have been reviewed. Patient and her husband understand that a permanent mesh is used for the midurethral sling for her urinary stress incontinence.  Risks include but are not limited to bleeding, infections, damage to surrounding organs, erosion and exposure of the permanent mesh, pelvic pain, painful intercourse, urinary retention and the need for catheterization, urinary urgency syndrome, slower voiding pattern, DVT, PE, death, reaction to anesthesia, pneumonia, and need for reoperation.  Patient wishes to proceed.    30 minutes face to face time was spent with the patient for which over 50% was spent in counseling.  An after visit summary was provided to the patient.

## 2013-03-17 ENCOUNTER — Other Ambulatory Visit: Payer: Self-pay

## 2013-03-21 ENCOUNTER — Telehealth: Payer: Self-pay | Admitting: Obstetrics and Gynecology

## 2013-03-21 NOTE — Telephone Encounter (Signed)
Spoke with pt who is at the orthopedic office currently to be seen for her neck. Pt knew she couldn't have pain meds like ibuprofen before surgery. Advised pt that acetaminophen products were OK before surgery, and pt will pass this along to her ortho doctor. Pt is wondering if camomile tea is OK to drink now before surgery. Advised I would pass along to Dr. Edward Jolly and let her know. Please advise.

## 2013-03-21 NOTE — Telephone Encounter (Signed)
I do not know of any contraindications for camomile tea prior to surgical procedures.

## 2013-03-21 NOTE — Telephone Encounter (Signed)
LM on pt's VM as indicated that Dr. Edward Jolly did not know of any contraindications for camomile tea. Pt to call back with any further questions.

## 2013-03-21 NOTE — Telephone Encounter (Signed)
Patient going to orthopaedic doctor for her back and patient has some questions for the nurse about taking pain medicine with acetaminophen? Patient has an upcoming surgery.

## 2013-03-24 ENCOUNTER — Ambulatory Visit (INDEPENDENT_AMBULATORY_CARE_PROVIDER_SITE_OTHER): Payer: BC Managed Care – PPO

## 2013-03-24 ENCOUNTER — Ambulatory Visit (INDEPENDENT_AMBULATORY_CARE_PROVIDER_SITE_OTHER): Payer: BC Managed Care – PPO | Admitting: Obstetrics and Gynecology

## 2013-03-24 ENCOUNTER — Encounter: Payer: Self-pay | Admitting: Obstetrics and Gynecology

## 2013-03-24 VITALS — BP 110/66 | HR 60 | Ht 71.0 in | Wt 191.0 lb

## 2013-03-24 DIAGNOSIS — N83202 Unspecified ovarian cyst, left side: Secondary | ICD-10-CM

## 2013-03-24 DIAGNOSIS — N83209 Unspecified ovarian cyst, unspecified side: Secondary | ICD-10-CM

## 2013-03-24 NOTE — Progress Notes (Unsigned)
   AMENDMENT TO ULTRASOUND REPORT

## 2013-03-24 NOTE — Progress Notes (Signed)
Subjective  Patient here for a recheck of the left ovary in preparation for upcoming pelvic reconstructive surgery.  Previously a complex left ovarian cyst was noted.  Patient has been having some dyspareunia correlating with the ovarian cyst.   Just had mammogram which was normal.   Having neck and back pain and is seeing Dr. Charlett Blake for care. Taking Vicodin.   Objective  See ultrasound below.   Complex left ovarian cyst, consistent with a hemorrhagic left ovarian cyst which is diminishing in size. The report below was amended by the technician and the word excresence was amended to be an echogenic are of the lateral wall noted.     Assessment  Complex left ovarian cyst resolving.   Plan  No need for oophorectomy at the time of the patient's prolapse and incontinence surgery. Has final presurgical visit next week.  Will do final ultrasound of the ovary in February.  15 minutes spent face to face with patient of which over 50% was spent in counseling.

## 2013-03-24 NOTE — Progress Notes (Signed)
   AMENDED ULTRASOUND REPORT;

## 2013-03-28 ENCOUNTER — Encounter (HOSPITAL_COMMUNITY): Payer: Self-pay

## 2013-03-29 ENCOUNTER — Encounter (HOSPITAL_COMMUNITY)
Admission: RE | Admit: 2013-03-29 | Discharge: 2013-03-29 | Disposition: A | Discharge status: Home / Self Care | Payer: BC Managed Care – PPO | Source: Ambulatory Visit | Attending: Obstetrics and Gynecology | Admitting: Obstetrics and Gynecology

## 2013-03-29 ENCOUNTER — Encounter (HOSPITAL_COMMUNITY): Payer: Self-pay

## 2013-03-29 DIAGNOSIS — Z01810 Encounter for preprocedural cardiovascular examination: Secondary | ICD-10-CM | POA: Insufficient documentation

## 2013-03-29 DIAGNOSIS — Z01812 Encounter for preprocedural laboratory examination: Secondary | ICD-10-CM | POA: Insufficient documentation

## 2013-03-29 DIAGNOSIS — Z01818 Encounter for other preprocedural examination: Secondary | ICD-10-CM | POA: Insufficient documentation

## 2013-03-29 HISTORY — DX: Hypothyroidism, unspecified: E03.9

## 2013-03-29 HISTORY — DX: Adverse effect of unspecified anesthetic, initial encounter: T41.45XA

## 2013-03-29 LAB — CBC
Hemoglobin: 14.9 g/dL (ref 12.0–15.0)
MCH: 29.9 pg (ref 26.0–34.0)
MCV: 88.8 fL (ref 78.0–100.0)
Platelets: 214 10*3/uL (ref 150–400)
RBC: 4.99 MIL/uL (ref 3.87–5.11)

## 2013-03-29 LAB — BASIC METABOLIC PANEL
CO2: 29 mEq/L (ref 19–32)
Calcium: 9.6 mg/dL (ref 8.4–10.5)
GFR calc Af Amer: >90 mL/min (ref >90)
GFR calc non Af Amer: >90 mL/min (ref >90)
Glucose, Bld: 98 mg/dL (ref 70–99)
Potassium: 4.5 mEq/L (ref 3.5–5.1)
Sodium: 137 mEq/L (ref 135–145)

## 2013-03-29 NOTE — Patient Instructions (Signed)
Your procedure is scheduled on:04/05/13  Enter through the Main Entrance at :6am Pick up desk phone and dial [phone] and inform us of your arrival.  Please call [phone] if you have any problems the morning of surgery.  Remember: Do not eat food or drink liquids, including water, after midnight:Monday   You may brush your teeth the morning of surgery.  Take these meds the morning of surgery with a sip of water: Thyroid, BP, Cymbalta, use Q-var  DO NOT wear jewelry, eye make-up, lipstick,body lotion, or dark fingernail polish.  (Polished toes are ok) You may wear deodorant.  If you are to be admitted after surgery, leave suitcase in car until your room has been assigned. Patients discharged on the day of surgery will not be allowed to drive home. Wear loose fitting, comfortable clothes for your ride home.

## 2013-03-30 ENCOUNTER — Encounter: Payer: Self-pay | Admitting: Obstetrics and Gynecology

## 2013-03-30 ENCOUNTER — Ambulatory Visit (INDEPENDENT_AMBULATORY_CARE_PROVIDER_SITE_OTHER): Payer: BC Managed Care – PPO | Admitting: Obstetrics and Gynecology

## 2013-03-30 VITALS — BP 120/78 | HR 80 | Ht 71.0 in | Wt 191.0 lb

## 2013-03-30 DIAGNOSIS — N816 Rectocele: Secondary | ICD-10-CM

## 2013-03-30 DIAGNOSIS — N8111 Cystocele, midline: Secondary | ICD-10-CM

## 2013-03-30 DIAGNOSIS — IMO0002 Reserved for concepts with insufficient information to code with codable children: Secondary | ICD-10-CM

## 2013-03-30 DIAGNOSIS — N393 Stress incontinence (female) (male): Secondary | ICD-10-CM

## 2013-03-30 MED ORDER — OXYCODONE-ACETAMINOPHEN 5-325 MG PO TABS: 2.0000 | ORAL_TABLET | ORAL | Status: DC | PRN

## 2013-03-30 MED ORDER — IBUPROFEN 800 MG PO TABS: 800.0000 mg | ORAL_TABLET | Freq: Three times a day (TID) | ORAL | Status: DC | PRN

## 2013-03-30 NOTE — Patient Instructions (Signed)
I will see you next week for surgery! 

## 2013-03-30 NOTE — Progress Notes (Signed)
Patient ID: [name], female   DOB: 1960-11-29, 52 y.o.   MRN: [id number]  GYNECOLOGY PROBLEM VISIT  PCP:   Referring provider:   HPI: 52 y.o.   Married  Caucasian  female   [phone] with Patient's last menstrual period was 02/26/2010.   here for  preop visit.  Patient scheduled for an anterior and posterior colporrhaphy with TVT and cystoscopy on 04/05/13. Patient has urinary incontinence with coughing, laughing, and sexual activity.  Urodynamic testing on 02/2913 confirms the presence of genuine stress incontinence. Desires surgical treatment.   Patient having neck pain and lower back pain.  Has a herniated disc between C1 and C2 and a deteriorating disc in the lower spine.   Also has arthritis.   Has asthma.  States she can tolerate Ibuprofen.    GYNECOLOGIC HISTORY: Patient's last menstrual period was 02/26/2010. Sexually active:  Yes. Partner preference:   Female. Contraception:  Hysterectomy. Menopausal hormone therapy:  No. DES exposure:   No.  Sexually transmitted diseases:  No. GYN Procedures:   Hysterectomy - robotic TLH. Mammogram:     03/18/13 - WNL.           Pap:   2011 - normal.  History of abnormal pap in 2006.   OB History   Grav Para Term Preterm Abortions TAB SAB Ect Mult Living   6 2 2  0 4  4   2     Largest child 9 pounds 1 ounces.      Family History  Problem Relation Age of Onset  . Brain cancer Mother     dec with brain & lung CA  . Lung cancer Mother   . Diabetes Maternal Grandmother   . Diabetes Maternal Grandfather   . Diabetes Paternal Grandmother   . Diabetes Paternal Grandfather   . Bladder Cancer Father     dec age 16  . Prostate cancer Father     Patient Active Problem List   Diagnosis Date Noted  . Left ovarian cyst 02/24/2013    Past Medical History  Diagnosis Date  . Asthma   . Hypertension   . Anxiety   . Depression   . Seasonal allergies   . Thyroid disease     hypothyroidism  . Hx of abnormal Pap smear  2006    no treatment to cervix  . Heart murmur   . Panic attack 03/04/13  . Headache(784.0)   . Hypothyroidism   . Complication of anesthesia 2000    aspirated- had been sick prior to surgery- asthma    Past Surgical History  Procedure Laterality Date  . Abdominal hysterectomy  02/2010    Robotic-TLH--Dr. Tresa Res  . Endometrial biopsy  10-10-09    Benign fragments of endometrial polyp  . Inguinal hernia repair      as an infant  . Liposuction  2006    thighs, chin    ALLERGIES: Bactrim; Other; and Ciprofloxacin  Current Outpatient Prescriptions  Medication Sig Dispense Refill  . albuterol (PROVENTIL HFA;VENTOLIN HFA) 108 (90 BASE) MCG/ACT inhaler Inhale 2 puffs into the lungs daily as needed for wheezing or shortness of breath (rescue inhaler).      . bisoprolol (ZEBETA) 5 MG tablet Take 2.5 mg by mouth daily.       . CELEBREX 200 MG capsule Take 200 mg by mouth daily.      . cetirizine (ZYRTEC) 10 MG tablet Take 10 mg by mouth daily.        . clonazePAM (KLONOPIN)  0.5 MG tablet Take 0.5 mg by mouth as needed for anxiety.      . DULoxetine (CYMBALTA) 60 MG capsule Take 60 mg by mouth daily.        Marland Kitchen EPIPEN 2-PAK 0.3 MG/0.3ML SOAJ injection Inject 0.3 mg into the muscle as needed.      Marland Kitchen HYDROcodone-acetaminophen (NORCO/VICODIN) 5-325 MG per tablet Take 2 tablets by mouth 2 (two) times daily as needed (for neck pain).       Marland Kitchen levothyroxine (SYNTHROID, LEVOTHROID) 75 MCG tablet Take 75 mcg by mouth daily.        . minocycline (MINOCIN,DYNACIN) 50 MG capsule Take 50 mg by mouth daily.      . mometasone (NASONEX) 50 MCG/ACT nasal spray Place 1 spray into the nose daily.       . montelukast (SINGULAIR) 10 MG tablet Take 10 mg by mouth at bedtime.      . Multiple Vitamin (MULTIVITAMIN) capsule Take 1 capsule by mouth daily.      . Probiotic Product (PROBIOTIC FORMULA) CAPS Take 1 capsule by mouth daily.        . promethazine (PHENERGAN) 25 MG tablet Take 25 mg by mouth as needed.       Marland Kitchen QVAR 80 MCG/ACT inhaler Inhale 2 puffs into the lungs 2 (two) times daily.       . traMADol (ULTRAM) 50 MG tablet Take 100 mg by mouth at bedtime.       . traZODone (DESYREL) 50 MG tablet Take 150 mg by mouth at bedtime.        No current facility-administered medications for this visit.     ROS:  Pertinent items are noted in HPI.  SOCIAL HISTORY:   Married.  Exercise: walking  Tobacco: no  Alcohol: 4 glasses of wine per week  Drug use: no  PHYSICAL EXAMINATION:    BP 120/78  Pulse 80  Ht 5\' 11"  (1.803 m)  Wt 191 lb (86.637 kg)  BMI 26.65 kg/m2  LMP 02/26/2010   Wt Readings from Last 3 Encounters:  03/30/13 191 lb (86.637 kg)  03/29/13 190 lb (86.183 kg)  03/24/13 191 lb (86.637 kg)     Ht Readings from Last 3 Encounters:  03/30/13 5\' 11"  (1.803 m)  03/29/13 5\' 11"  (1.803 m)  03/24/13 5\' 11"  (1.803 m)    General appearance: alert, cooperative and appears stated age Head: Normocephalic, without obvious abnormality, atraumatic Neck: no adenopathy, supple, symmetrical, trachea midline and thyroid not enlarged, symmetric, no tenderness/mass/nodules Lungs: clear to auscultation bilaterally Heart: regular rate and rhythm Abdomen: soft, non-tender; no masses,  no organomegaly Extremities: extremities normal, atraumatic, no cyanosis or edema Skin: Skin color, texture, turgor normal. No rashes or lesions Lymph nodes: Cervical, supraclavicular, and axillary nodes normal. No abnormal inguinal nodes palpated Neurologic: Grossly normal  Pelvic: External genitalia:  no lesions              Urethra:  normal appearing urethra with no masses, tenderness or lesions              Bartholins and Skenes: normal                 Vagina: normal appearing vagina with normal color and discharge, no lesions.  First degree cystocele and first degree rectocele.              Cervix:  Absent.                 Bimanual Exam:  Uterus:  uterus is absent.                                        Adnexa: normal adnexa in size, nontender and no masses                                      Rectovaginal: Confirms                                      Anus:  normal sphincter tone, no lesions  ASSESSMENT  Genuine stress incontinence. Incomplete vaginal prolapse. Asthma.  Patient is able to tolerate NSAIDs.    PLAN  We will plan to proceed with an anterior and posterior colporrhaphy with native tissue repair and a TVT Exact midurethral sling. Benefits and risks and alternatives have been reviewed. Patient and her husband understand that a permanent mesh is used for the midurethral sling for her urinary stress incontinence. Risks include but are not limited to bleeding, infections, damage to surrounding organs, erosion and exposure of the permanent mesh, pelvic pain, painful intercourse, urinary retention and the need for catheterization, urinary urgency syndrome, slower voiding pattern, DVT, PE, death, reaction to anesthesia, pneumonia, and need for reoperation. Patient wishes to proceed.   30 minutes face to face time of which over 50% was spent in counseling.   An After Visit Summary was printed and given to the patient.

## 2013-03-31 ENCOUNTER — Other Ambulatory Visit: Payer: Self-pay

## 2013-03-31 ENCOUNTER — Ambulatory Visit: Payer: Self-pay | Admitting: Obstetrics and Gynecology

## 2013-04-04 ENCOUNTER — Encounter (HOSPITAL_COMMUNITY): Payer: Self-pay | Admitting: Anesthesiology

## 2013-04-04 NOTE — H&P (Signed)
[name]  03/30/2013 8:30 AM   Office Visit  MRN:  [id number]   Description: 52 year old female  Provider: Melony Overly, MD  Department: [facility]      Diagnoses    Genuine stress incontinence, female    -  Primary    625.6    Cystocele        618.01    Rectocele        618.04      Reason for Visit    Discuss surgery       Current Vitals - Last Recorded    BP Pulse Ht Wt BMI LMP    120/78 80 5\' 11"  (1.803 m) 191 lb (86.637 kg) 26.65 kg/m2 02/26/2010       Vitals History Recorded     Progress Notes    Melony Overly, MD at 03/30/2013  9:08 AM    Status: Signed            Patient ID: [name], female   DOB: 09-23-1960, 52 y.o.   MRN: [id number]  GYNECOLOGY PROBLEM VISIT  PCP:   Referring provider:   HPI: 52 y.o.   Married  Caucasian  female    [phone] with Patient's last menstrual period was 02/26/2010.    here for  preop visit.  Patient scheduled for an anterior and posterior colporrhaphy with TVT and cystoscopy on 04/05/13. Patient has urinary incontinence with coughing, laughing, and sexual activity.   Urodynamic testing on 02/2913 confirms the presence of genuine stress incontinence. Desires surgical treatment.    Patient having neck pain and lower back pain.   Has a herniated disc between C1 and C2 and a deteriorating disc in the lower spine.    Also has arthritis.   Has asthma.  States she can tolerate Ibuprofen.    GYNECOLOGIC HISTORY: Patient's last menstrual period was 02/26/2010. Sexually active:  Yes. Partner preference:   Female. Contraception:  Hysterectomy. Menopausal hormone therapy:  No. DES exposure:   No.   Sexually transmitted diseases:  No. GYN Procedures:   Hysterectomy - robotic TLH. Mammogram:     03/18/13 - WNL.            Pap:   2011 - normal.  History of abnormal pap in 2006.    OB History     Grav  Para  Term  Preterm  Abortions  TAB  SAB  Ect  Mult  Living     6  2  2   0  4    4      2      Largest child 9 pounds 1 ounces.       Family History   Problem  Relation  Age of Onset   .  Brain cancer  Mother         dec with brain & lung CA   .  Lung cancer  Mother     .  Diabetes  Maternal Grandmother     .  Diabetes  Maternal Grandfather     .  Diabetes  Paternal Grandmother     .  Diabetes  Paternal Grandfather     .  Bladder Cancer  Father         dec age 79   .  Prostate cancer  Father         Patient Active Problem List     Diagnosis  Date Noted   .  Left ovarian cyst  02/24/2013       Past Medical History   Diagnosis  Date   .  Asthma     .  Hypertension     .  Anxiety     .  Depression     .  Seasonal allergies     .  Thyroid disease         hypothyroidism   .  Hx of abnormal Pap smear  2006       no treatment to cervix   .  Heart murmur     .  Panic attack  03/04/13   .  Headache(784.0)     .  Hypothyroidism     .  Complication of anesthesia  2000       aspirated- had been sick prior to surgery- asthma       Past Surgical History   Procedure  Laterality  Date   .  Abdominal hysterectomy    02/2010       Robotic-TLH--Dr. Tresa Res   .  Endometrial biopsy    10-10-09       Benign fragments of endometrial polyp   .  Inguinal hernia repair           as an infant   .  Liposuction    2006       thighs, chin     ALLERGIES: Bactrim; Other; and Ciprofloxacin    Current Outpatient Prescriptions   Medication  Sig  Dispense  Refill   .  albuterol (PROVENTIL HFA;VENTOLIN HFA) 108 (90 BASE) MCG/ACT inhaler  Inhale 2 puffs into the lungs daily as needed for wheezing or shortness of breath (rescue inhaler).         .  bisoprolol (ZEBETA) 5 MG tablet  Take 2.5 mg by mouth daily.          .  CELEBREX 200 MG capsule  Take 200 mg by mouth daily.         .  cetirizine (ZYRTEC) 10 MG tablet  Take 10 mg by mouth daily.           .  clonazePAM (KLONOPIN) 0.5 MG tablet  Take 0.5 mg by mouth as needed for anxiety.         .  DULoxetine (CYMBALTA) 60 MG capsule  Take  60 mg by mouth daily.           Marland Kitchen  EPIPEN 2-PAK 0.3 MG/0.3ML SOAJ injection  Inject 0.3 mg into the muscle as needed.         Marland Kitchen  HYDROcodone-acetaminophen (NORCO/VICODIN) 5-325 MG per tablet  Take 2 tablets by mouth 2 (two) times daily as needed (for neck pain).          Marland Kitchen  levothyroxine (SYNTHROID, LEVOTHROID) 75 MCG tablet  Take 75 mcg by mouth daily.           .  minocycline (MINOCIN,DYNACIN) 50 MG capsule  Take 50 mg by mouth daily.         .  mometasone (NASONEX) 50 MCG/ACT nasal spray  Place 1 spray into the nose daily.          .  montelukast (SINGULAIR) 10 MG tablet  Take 10 mg by mouth at bedtime.         .  Multiple Vitamin (MULTIVITAMIN) capsule  Take 1 capsule by mouth daily.         .  Probiotic Product (PROBIOTIC FORMULA) CAPS  Take 1 capsule by mouth daily.           Marland Kitchen  promethazine (PHENERGAN) 25 MG tablet  Take 25 mg by mouth as needed.         Marland Kitchen  QVAR 80 MCG/ACT inhaler  Inhale 2 puffs into the lungs 2 (two) times daily.          .  traMADol (ULTRAM) 50 MG tablet  Take 100 mg by mouth at bedtime.          .  traZODone (DESYREL) 50 MG tablet  Take 150 mg by mouth at bedtime.             No current facility-administered medications for this visit.      ROS:  Pertinent items are noted in HPI.  SOCIAL HISTORY:   Married.   Exercise: walking   Tobacco: no   Alcohol: 4 glasses of wine per week   Drug use: no  PHYSICAL EXAMINATION:    BP 120/78  Pulse 80  Ht 5\' 11"  (1.803 m)  Wt 191 lb (86.637 kg)  BMI 26.65 kg/m2  LMP 02/26/2010    Wt Readings from Last 3 Encounters:   03/30/13  191 lb (86.637 kg)   03/29/13  190 lb (86.183 kg)   03/24/13  191 lb (86.637 kg)       Ht Readings from Last 3 Encounters:   03/30/13  5\' 11"  (1.803 m)   03/29/13  5\' 11"  (1.803 m)   03/24/13  5\' 11"  (1.803 m)     General appearance: alert, cooperative and appears stated age Head: Normocephalic, without obvious abnormality, atraumatic Neck: no adenopathy, supple, symmetrical, trachea  midline and thyroid not enlarged, symmetric, no tenderness/mass/nodules Lungs: clear to auscultation bilaterally Heart: regular rate and rhythm Abdomen: soft, non-tender; no masses,  no organomegaly Extremities: extremities normal, atraumatic, no cyanosis or edema Skin: Skin color, texture, turgor normal. No rashes or lesions Lymph nodes: Cervical, supraclavicular, and axillary nodes normal. No abnormal inguinal nodes palpated Neurologic: Grossly normal  Pelvic: External genitalia:  no lesions              Urethra:  normal appearing urethra with no masses, tenderness or lesions              Bartholins and Skenes: normal                  Vagina: normal appearing vagina with normal color and discharge, no lesions.  First degree cystocele and first degree rectocele.              Cervix:  Absent.                   Bimanual Exam:  Uterus:  uterus is absent.                                       Adnexa: normal adnexa in size, nontender and no masses                                      Rectovaginal: Confirms                                      Anus:  normal sphincter tone, no lesions  ASSESSMENT  Genuine stress incontinence. Incomplete vaginal prolapse. Asthma.  Patient is able  to tolerate NSAIDs.    PLAN  We will plan to proceed with an anterior and posterior colporrhaphy with native tissue repair and a TVT Exact midurethral sling. Benefits and risks and alternatives have been reviewed. Patient and her husband understand that a permanent mesh is used for the midurethral sling for her urinary stress incontinence. Risks include but are not limited to bleeding, infections, damage to surrounding organs, erosion and exposure of the permanent mesh, pelvic pain, painful intercourse, urinary retention and the need for catheterization, urinary urgency syndrome, slower voiding pattern, DVT, PE, death, reaction to anesthesia, pneumonia, and need for reoperation. Patient wishes to proceed.   30  minutes face to face time of which over 50% was spent in counseling.   An After Visit Summary was printed and given to the patient.

## 2013-04-04 NOTE — Anesthesia Preprocedure Evaluation (Addendum)
Anesthesia Evaluation  Patient identified by MRN, date of birth, ID band Patient awake    Reviewed: Allergy & Precautions, H&P , NPO status , Patient's Chart, lab work & pertinent test results  Airway Mallampati: I TM Distance: >3 FB Neck ROM: Full    Dental no notable dental hx. (+) Teeth Intact   Pulmonary asthma ,  breath sounds clear to auscultation  Pulmonary exam normal       Cardiovascular hypertension, Pt. on medications and Pt. on home beta blockers negative cardio ROS  + Valvular Problems/Murmurs Rhythm:Regular Rate:Normal     Neuro/Psych  Headaches, PSYCHIATRIC DISORDERS Anxiety Depression Panic Attacks   GI/Hepatic negative GI ROS, Neg liver ROS,   Endo/Other  Hypothyroidism   Renal/GU negative Renal ROS Bladder dysfunction  Genuine SUI Cystocele Rectocele    Musculoskeletal negative musculoskeletal ROS (+)   Abdominal   Peds  Hematology   Anesthesia Other Findings   Reproductive/Obstetrics negative OB ROS                          Anesthesia Physical Anesthesia Plan  ASA: II  Anesthesia Plan: Spinal   Post-op Pain Management:    Induction:   Airway Management Planned: Natural Airway and Nasal Cannula  Additional Equipment:   Intra-op Plan:   Post-operative Plan:   Informed Consent: I have reviewed the patients History and Physical, chart, labs and discussed the procedure including the risks, benefits and alternatives for the proposed anesthesia with the patient or authorized representative who has indicated his/her understanding and acceptance.   Dental advisory given  Plan Discussed with: CRNA, Anesthesiologist and Surgeon  Anesthesia Plan Comments:         Anesthesia Quick Evaluation

## 2013-04-05 ENCOUNTER — Ambulatory Visit (HOSPITAL_COMMUNITY): Payer: BC Managed Care – PPO | Admitting: Anesthesiology

## 2013-04-05 ENCOUNTER — Observation Stay (HOSPITAL_COMMUNITY)
Admission: RE | Admit: 2013-04-05 | Discharge: 2013-04-06 | Disposition: A | Discharge status: Home / Self Care | Payer: BC Managed Care – PPO | Source: Ambulatory Visit | Attending: Obstetrics and Gynecology | Admitting: Obstetrics and Gynecology

## 2013-04-05 ENCOUNTER — Encounter (HOSPITAL_COMMUNITY): Payer: BC Managed Care – PPO | Admitting: Anesthesiology

## 2013-04-05 ENCOUNTER — Encounter (HOSPITAL_COMMUNITY)
Admission: RE | Disposition: A | Discharge status: Home / Self Care | Payer: Self-pay | Source: Ambulatory Visit | Attending: Obstetrics and Gynecology

## 2013-04-05 ENCOUNTER — Encounter (HOSPITAL_COMMUNITY): Payer: Self-pay | Admitting: Anesthesiology

## 2013-04-05 DIAGNOSIS — N993 Prolapse of vaginal vault after hysterectomy: Principal | ICD-10-CM | POA: Insufficient documentation

## 2013-04-05 DIAGNOSIS — N812 Incomplete uterovaginal prolapse: Secondary | ICD-10-CM

## 2013-04-05 DIAGNOSIS — N393 Stress incontinence (female) (male): Secondary | ICD-10-CM | POA: Diagnosis not present

## 2013-04-05 HISTORY — PX: ANTERIOR AND POSTERIOR REPAIR: SHX5121

## 2013-04-05 HISTORY — PX: BLADDER SUSPENSION: SHX72

## 2013-04-05 SURGERY — ANTERIOR (CYSTOCELE) AND POSTERIOR REPAIR (RECTOCELE)
Anesthesia: Epidural | Site: Vagina | Wound class: Clean Contaminated

## 2013-04-05 MED ORDER — KETOROLAC TROMETHAMINE 30 MG/ML IJ SOLN: 15.0000 mg | Freq: Once | INTRAMUSCULAR | Status: DC | PRN

## 2013-04-05 MED ORDER — MEPERIDINE HCL 25 MG/ML IJ SOLN: 6.2500 mg | INTRAMUSCULAR | Status: DC | PRN

## 2013-04-05 MED ORDER — SODIUM BICARBONATE 8.4 % IV SOLN
INTRAVENOUS | Status: DC | PRN
  Administered 2013-04-05: 5 mL via EPIDURAL

## 2013-04-05 MED ORDER — LIDOCAINE HCL (CARDIAC) 20 MG/ML IV SOLN
INTRAVENOUS | Status: AC
  Filled 2013-04-05: qty 5

## 2013-04-05 MED ORDER — LACTATED RINGERS IV SOLN
INTRAVENOUS | Status: DC
  Administered 2013-04-05 (×2): via INTRAVENOUS

## 2013-04-05 MED ORDER — OXYCODONE-ACETAMINOPHEN 5-325 MG PO TABS
1.0000 | ORAL_TABLET | ORAL | Status: DC | PRN
  Administered 2013-04-05 – 2013-04-06 (×5): 2 via ORAL
  Filled 2013-04-05 (×5): qty 2

## 2013-04-05 MED ORDER — ESTRADIOL 0.1 MG/GM VA CREA
TOPICAL_CREAM | VAGINAL | Status: AC
  Filled 2013-04-05: qty 42.5

## 2013-04-05 MED ORDER — KETOROLAC TROMETHAMINE 30 MG/ML IJ SOLN
30.0000 mg | Freq: Four times a day (QID) | INTRAMUSCULAR | Status: DC
  Administered 2013-04-05 – 2013-04-06 (×2): 30 mg via INTRAVENOUS
  Filled 2013-04-05 (×4): qty 1

## 2013-04-05 MED ORDER — PROPOFOL 10 MG/ML IV EMUL
INTRAVENOUS | Status: AC
  Filled 2013-04-05: qty 20

## 2013-04-05 MED ORDER — KETOROLAC TROMETHAMINE 30 MG/ML IJ SOLN
INTRAMUSCULAR | Status: DC | PRN
  Administered 2013-04-05: 30 mg via INTRAVENOUS

## 2013-04-05 MED ORDER — ONDANSETRON HCL 4 MG/2ML IJ SOLN: 4.0000 mg | Freq: Four times a day (QID) | INTRAMUSCULAR | Status: DC | PRN

## 2013-04-05 MED ORDER — MORPHINE SULFATE 0.5 MG/ML IJ SOLN
INTRAMUSCULAR | Status: AC
  Filled 2013-04-05: qty 10

## 2013-04-05 MED ORDER — MONTELUKAST SODIUM 10 MG PO TABS
10.0000 mg | ORAL_TABLET | Freq: Every day | ORAL | Status: DC
  Administered 2013-04-05: 10 mg via ORAL
  Filled 2013-04-05: qty 1

## 2013-04-05 MED ORDER — METOCLOPRAMIDE HCL 5 MG/ML IJ SOLN: 10.0000 mg | Freq: Once | INTRAMUSCULAR | Status: DC | PRN

## 2013-04-05 MED ORDER — MIDAZOLAM HCL 2 MG/2ML IJ SOLN
INTRAMUSCULAR | Status: AC
  Filled 2013-04-05: qty 2

## 2013-04-05 MED ORDER — LORATADINE 10 MG PO TABS
10.0000 mg | ORAL_TABLET | Freq: Every day | ORAL | Status: DC
  Filled 2013-04-05 (×2): qty 1

## 2013-04-05 MED ORDER — NALOXONE HCL 0.4 MG/ML IJ SOLN: 0.4000 mg | INTRAMUSCULAR | Status: DC | PRN

## 2013-04-05 MED ORDER — ONDANSETRON HCL 4 MG/2ML IJ SOLN
INTRAMUSCULAR | Status: AC
  Filled 2013-04-05: qty 2

## 2013-04-05 MED ORDER — IBUPROFEN 600 MG PO TABS: 600.0000 mg | ORAL_TABLET | Freq: Four times a day (QID) | ORAL | Status: DC | PRN

## 2013-04-05 MED ORDER — FENTANYL CITRATE 0.05 MG/ML IJ SOLN
INTRAMUSCULAR | Status: AC
  Filled 2013-04-05: qty 2

## 2013-04-05 MED ORDER — GLYCOPYRROLATE 0.2 MG/ML IJ SOLN
INTRAMUSCULAR | Status: DC | PRN
  Administered 2013-04-05: 0.2 mg via INTRAVENOUS

## 2013-04-05 MED ORDER — FLUTICASONE PROPIONATE HFA 44 MCG/ACT IN AERO
2.0000 | INHALATION_SPRAY | Freq: Two times a day (BID) | RESPIRATORY_TRACT | Status: DC
  Administered 2013-04-05: 2 via RESPIRATORY_TRACT
  Filled 2013-04-05: qty 10.6

## 2013-04-05 MED ORDER — DEXAMETHASONE SODIUM PHOSPHATE 10 MG/ML IJ SOLN
INTRAMUSCULAR | Status: DC | PRN
  Administered 2013-04-05: 10 mg via INTRAVENOUS

## 2013-04-05 MED ORDER — MIDAZOLAM HCL 5 MG/5ML IJ SOLN
INTRAMUSCULAR | Status: DC | PRN
  Administered 2013-04-05: 2 mg via INTRAVENOUS

## 2013-04-05 MED ORDER — PROPOFOL INFUSION 10 MG/ML OPTIME
INTRAVENOUS | Status: DC | PRN
  Administered 2013-04-05 (×4): via INTRAVENOUS
  Administered 2013-04-05: 75 ug/kg/min via INTRAVENOUS

## 2013-04-05 MED ORDER — BISOPROLOL FUMARATE 5 MG PO TABS
2.5000 mg | ORAL_TABLET | Freq: Every day | ORAL | Status: DC
  Filled 2013-04-05 (×2): qty 0.5

## 2013-04-05 MED ORDER — PROPOFOL 10 MG/ML IV EMUL
INTRAVENOUS | Status: AC
  Filled 2013-04-05: qty 40

## 2013-04-05 MED ORDER — BUPIVACAINE IN DEXTROSE 0.75-8.25 % IT SOLN
INTRATHECAL | Status: AC
  Filled 2013-04-05: qty 2

## 2013-04-05 MED ORDER — ALBUTEROL SULFATE HFA 108 (90 BASE) MCG/ACT IN AERS: 2.0000 | INHALATION_SPRAY | Freq: Every day | RESPIRATORY_TRACT | Status: DC | PRN

## 2013-04-05 MED ORDER — LACTATED RINGERS IV SOLN: INTRAVENOUS | Status: DC

## 2013-04-05 MED ORDER — DULOXETINE HCL 60 MG PO CPEP
60.0000 mg | ORAL_CAPSULE | Freq: Every day | ORAL | Status: DC
  Filled 2013-04-05 (×2): qty 1

## 2013-04-05 MED ORDER — DEXTROSE 5 % IV SOLN
2.0000 g | INTRAVENOUS | Status: AC
  Administered 2013-04-05: 2 g via INTRAVENOUS
  Filled 2013-04-05: qty 2

## 2013-04-05 MED ORDER — KETOROLAC TROMETHAMINE 30 MG/ML IJ SOLN: 30.0000 mg | Freq: Four times a day (QID) | INTRAMUSCULAR | Status: DC | PRN

## 2013-04-05 MED ORDER — MENTHOL 3 MG MT LOZG
1.0000 | LOZENGE | OROMUCOSAL | Status: DC | PRN
  Administered 2013-04-06: 3 mg via ORAL
  Filled 2013-04-05: qty 9

## 2013-04-05 MED ORDER — BUPIVACAINE IN DEXTROSE 0.75-8.25 % IT SOLN
INTRATHECAL | Status: DC | PRN
  Administered 2013-04-05: 15 mg via INTRATHECAL

## 2013-04-05 MED ORDER — LIDOCAINE HCL (CARDIAC) 20 MG/ML IV SOLN
INTRAVENOUS | Status: DC | PRN
  Administered 2013-04-05: 40 mg via INTRAVENOUS

## 2013-04-05 MED ORDER — NALOXONE HCL 1 MG/ML IJ SOLN
1.0000 ug/kg/h | INTRAVENOUS | Status: DC | PRN
  Filled 2013-04-05: qty 2

## 2013-04-05 MED ORDER — ONDANSETRON HCL 4 MG PO TABS: 4.0000 mg | ORAL_TABLET | Freq: Four times a day (QID) | ORAL | Status: DC | PRN

## 2013-04-05 MED ORDER — KETOROLAC TROMETHAMINE 30 MG/ML IJ SOLN
INTRAMUSCULAR | Status: AC
  Filled 2013-04-05: qty 1

## 2013-04-05 MED ORDER — MORPHINE SULFATE (PF) 0.5 MG/ML IJ SOLN
INTRAMUSCULAR | Status: DC | PRN
  Administered 2013-04-05: 4.85 mg via EPIDURAL
  Administered 2013-04-05: .15 mg via INTRATHECAL

## 2013-04-05 MED ORDER — SODIUM CHLORIDE 0.9 % IJ SOLN: 3.0000 mL | INTRAMUSCULAR | Status: DC | PRN

## 2013-04-05 MED ORDER — BECLOMETHASONE DIPROPIONATE 80 MCG/ACT IN AERS
2.0000 | INHALATION_SPRAY | Freq: Two times a day (BID) | RESPIRATORY_TRACT | Status: DC
  Filled 2013-04-05: qty 8.7

## 2013-04-05 MED ORDER — KETOROLAC TROMETHAMINE 30 MG/ML IJ SOLN
30.0000 mg | Freq: Four times a day (QID) | INTRAMUSCULAR | Status: DC | PRN
  Administered 2013-04-05 (×2): 30 mg via INTRAVENOUS

## 2013-04-05 MED ORDER — LIDOCAINE-EPINEPHRINE 1 %-1:100000 IJ SOLN
INTRAMUSCULAR | Status: AC
  Filled 2013-04-05: qty 1

## 2013-04-05 MED ORDER — METHYLENE BLUE 1 % INJ SOLN
INTRAMUSCULAR | Status: AC
  Filled 2013-04-05: qty 10

## 2013-04-05 MED ORDER — DEXAMETHASONE SODIUM PHOSPHATE 10 MG/ML IJ SOLN
INTRAMUSCULAR | Status: AC
  Filled 2013-04-05: qty 1

## 2013-04-05 MED ORDER — FENTANYL CITRATE 0.05 MG/ML IJ SOLN: 25.0000 ug | INTRAMUSCULAR | Status: DC | PRN

## 2013-04-05 MED ORDER — LIDOCAINE-EPINEPHRINE (PF) 2 %-1:200000 IJ SOLN
INTRAMUSCULAR | Status: AC
  Filled 2013-04-05: qty 20

## 2013-04-05 MED ORDER — FLUTICASONE PROPIONATE 50 MCG/ACT NA SUSP
1.0000 | Freq: Every day | NASAL | Status: DC
  Administered 2013-04-05: 1 via NASAL
  Filled 2013-04-05: qty 16

## 2013-04-05 MED ORDER — METOCLOPRAMIDE HCL 5 MG/ML IJ SOLN: 10.0000 mg | Freq: Three times a day (TID) | INTRAMUSCULAR | Status: DC | PRN

## 2013-04-05 MED ORDER — MINOCYCLINE HCL 50 MG PO CAPS
50.0000 mg | ORAL_CAPSULE | Freq: Every day | ORAL | Status: DC
  Filled 2013-04-05 (×2): qty 1

## 2013-04-05 MED ORDER — FENTANYL CITRATE 0.05 MG/ML IJ SOLN
INTRAMUSCULAR | Status: DC | PRN
  Administered 2013-04-05: 75 ug via INTRAVENOUS
  Administered 2013-04-05: 25 ug via INTRATHECAL

## 2013-04-05 MED ORDER — LEVOTHYROXINE SODIUM 75 MCG PO TABS
75.0000 ug | ORAL_TABLET | Freq: Every day | ORAL | Status: DC
  Filled 2013-04-05: qty 1

## 2013-04-05 MED ORDER — NALBUPHINE SYRINGE 5 MG/0.5 ML
5.0000 mg | INJECTION | INTRAMUSCULAR | Status: DC | PRN
  Filled 2013-04-05: qty 1

## 2013-04-05 MED ORDER — LACTATED RINGERS IV SOLN
INTRAVENOUS | Status: DC
  Administered 2013-04-05 (×3): via INTRAVENOUS

## 2013-04-05 MED ORDER — PROPOFOL 10 MG/ML IV BOLUS
INTRAVENOUS | Status: DC | PRN
  Administered 2013-04-05: 30 mg via INTRAVENOUS

## 2013-04-05 MED ORDER — ONDANSETRON HCL 4 MG/2ML IJ SOLN
INTRAMUSCULAR | Status: DC | PRN
  Administered 2013-04-05: 4 mg via INTRAVENOUS

## 2013-04-05 MED ORDER — ONDANSETRON HCL 4 MG/2ML IJ SOLN: 4.0000 mg | Freq: Three times a day (TID) | INTRAMUSCULAR | Status: DC | PRN

## 2013-04-05 MED ORDER — GLYCOPYRROLATE 0.2 MG/ML IJ SOLN
INTRAMUSCULAR | Status: AC
  Filled 2013-04-05: qty 1

## 2013-04-05 MED ORDER — SODIUM BICARBONATE 8.4 % IV SOLN
INTRAVENOUS | Status: AC
  Filled 2013-04-05: qty 50

## 2013-04-05 MED ORDER — LIDOCAINE-EPINEPHRINE 1 %-1:100000 IJ SOLN
INTRAMUSCULAR | Status: DC | PRN
  Administered 2013-04-05: 7 mL
  Administered 2013-04-05: 8 mL

## 2013-04-05 MED ORDER — SCOPOLAMINE 1 MG/3DAYS TD PT72: 1.0000 | MEDICATED_PATCH | Freq: Once | TRANSDERMAL | Status: DC

## 2013-04-05 SURGICAL SUPPLY — 43 items
ADH SKN CLS APL DERMABOND .7 (GAUZE/BANDAGES/DRESSINGS) ×1
BLADE SURG 11 STRL SS (BLADE) ×2 IMPLANT
CANISTER SUCT 3000ML (MISCELLANEOUS) ×2 IMPLANT
CATH FOLEY 2WAY SLVR  5CC 18FR (CATHETERS) ×1
CATH FOLEY 2WAY SLVR 5CC 18FR (CATHETERS) ×1 IMPLANT
CLOTH BEACON ORANGE TIMEOUT ST (SAFETY) ×2 IMPLANT
CONT PATH 16OZ SNAP LID 3702 (MISCELLANEOUS) IMPLANT
DECANTER SPIKE VIAL GLASS SM (MISCELLANEOUS) IMPLANT
DERMABOND ADVANCED (GAUZE/BANDAGES/DRESSINGS) ×1
DERMABOND ADVANCED .7 DNX12 (GAUZE/BANDAGES/DRESSINGS) ×1 IMPLANT
DEVICE CAPIO SLIM SINGLE (INSTRUMENTS) IMPLANT
DRAPE HYSTEROSCOPY (DRAPE) ×2 IMPLANT
GAUZE PACKING 2X5 YD STERILE (GAUZE/BANDAGES/DRESSINGS) ×2 IMPLANT
GAUZE PACKING IODOFORM 1 (PACKING) ×1 IMPLANT
GLOVE BIO SURGEON STRL SZ 6.5 (GLOVE) ×3 IMPLANT
GLOVE BIOGEL PI IND STRL 6.5 (GLOVE) ×1 IMPLANT
GLOVE BIOGEL PI INDICATOR 6.5 (GLOVE) ×2
GOWN PREVENTION PLUS LG XLONG (DISPOSABLE) ×6 IMPLANT
GOWN STRL REIN XL XLG (GOWN DISPOSABLE) ×7 IMPLANT
MATRIX HEMOSTAT SURGIFLO (HEMOSTASIS) ×1 IMPLANT
NDL MAYO 6 CRC TAPER PT (NEEDLE) IMPLANT
NEEDLE HYPO 22GX1.5 SAFETY (NEEDLE) ×2 IMPLANT
NEEDLE MAYO 6 CRC TAPER PT (NEEDLE) IMPLANT
NS IRRIG 1000ML POUR BTL (IV SOLUTION) ×2 IMPLANT
PACK VAGINAL WOMENS (CUSTOM PROCEDURE TRAY) ×2 IMPLANT
PLUG CATH AND CAP STER (CATHETERS) ×2 IMPLANT
SET CYSTO W/LG BORE CLAMP LF (SET/KITS/TRAYS/PACK) ×2 IMPLANT
SLING TVT EXACT (Sling) ×1 IMPLANT
SPONGE SURGIFOAM ABS GEL 12-7 (HEMOSTASIS) IMPLANT
SURGIFLO TRUKIT (HEMOSTASIS) IMPLANT
SUT CAPIO ETHIBPND (SUTURE) IMPLANT
SUT VIC AB 0 CT1 27 (SUTURE) ×8
SUT VIC AB 0 CT1 27XBRD ANBCTR (SUTURE) ×3 IMPLANT
SUT VIC AB 2-0 CT1 27 (SUTURE)
SUT VIC AB 2-0 CT1 TAPERPNT 27 (SUTURE) IMPLANT
SUT VIC AB 2-0 CT2 27 (SUTURE) IMPLANT
SUT VIC AB 2-0 SH 27 (SUTURE) ×12
SUT VIC AB 2-0 SH 27XBRD (SUTURE) ×4 IMPLANT
SUT VIC AB 2-0 UR6 27 (SUTURE) IMPLANT
TOWEL OR 17X24 6PK STRL BLUE (TOWEL DISPOSABLE) ×4 IMPLANT
TRAY FOLEY BAG SILVER LF 16FR (CATHETERS) ×2 IMPLANT
TUBING CONNECTING 10 (TUBING) ×2 IMPLANT
WATER STERILE IRR 1000ML POUR (IV SOLUTION) ×1 IMPLANT

## 2013-04-05 NOTE — Brief Op Note (Signed)
04/05/2013  9:50 AM  PATIENT:  [name]  52 y.o. female  PRE-OPERATIVE DIAGNOSIS:  SUI, cystocele,rectocele  POST-OPERATIVE DIAGNOSIS:  SUI, cystocele,rectocele  PROCEDURE:  Procedure(s) with comments: ANTERIOR (CYSTOCELE) AND POSTERIOR REPAIR (RECTOCELE) (N/A) TRANSVAGINAL TAPE (TVT) PROCEDURE WITH CYSTO (N/A) - site- vagina and urethra  SURGEON:  Surgeon(s) and Role:    * Melony Overly, MD - Primary    * Annamaria Boots, MD - Assisting  PHYSICIAN ASSISTANT:   ASSISTANTS:  Annamaria Boots, MD   ANESTHESIA:   epidural and spinal  EBL:  Total I/O In: 1000 [I.V.:1000] Out: 500 [Urine:400; Blood:100]  BLOOD ADMINISTERED:none  DRAINS: Urinary Catheter (Foley)   LOCAL MEDICATIONS USED:  LIDOCAINE   SPECIMEN:  No Specimen  DISPOSITION OF SPECIMEN:  N/A  COUNTS:  YES  TOURNIQUET:  * No tourniquets in log *  DICTATION: .Other Dictation: Dictation Number    PLAN OF CARE: Admit for overnight observation  PATIENT DISPOSITION:  PACU - hemodynamically stable.   Delay start of Pharmacological VTE agent (>24hrs) due to surgical blood loss or risk of bleeding: not applicable

## 2013-04-05 NOTE — Progress Notes (Signed)
Day of Surgery Procedure(s) (LRB): ANTERIOR (CYSTOCELE) AND POSTERIOR REPAIR (RECTOCELE) (N/A) TRANSVAGINAL TAPE (TVT) PROCEDURE WITH CYSTO (N/A)  Subjective: Patient reports tolerating PO. Taking liquids. Ambulated. Took Percocet for pain.  Toradol did not help.    Objective: I have reviewed patient's vital signs and intake and output.  General: alert Resp: clear to auscultation bilaterally Cardio: regular rate and rhythm, S1, S2 normal, no murmur, click, rub or gallop GI: soft, non-tender; bowel sounds normal; no masses,  no organomegaly and incision: clean, dry and intact Extremities:  DPs 2+ bilaterally.  PAS and Ted hose on.  Vaginal Bleeding: minimal  Assessment: s/p Procedure(s) with comments: ANTERIOR (CYSTOCELE) AND POSTERIOR REPAIR (RECTOCELE) (N/A) TRANSVAGINAL TAPE (TVT) PROCEDURE WITH CYSTO (N/A) - site- vagina and urethra: stable  Plan: Advance diet Advance to PO medication  Voiding trials in the am.   Vaginal packing out in the am. CBC and BMP in am.    LOS: 0 days    [name] 04/05/2013, 5:01 PM

## 2013-04-05 NOTE — Op Note (Signed)
[name], [name]        ACCOUNT NO.:  [id number]  MEDICAL RECORD NO.:  [id number]  LOCATION:  9302                          FACILITY:  [facility]  PHYSICIAN:  Randye Lobo, M.D.   DATE OF BIRTH:  [birth year]  DATE OF PROCEDURE:  04/05/2013 DATE OF DISCHARGE:                              OPERATIVE REPORT   PREOPERATIVE DIAGNOSES: 1. Genuine stress incontinence. 2. Incomplete vaginal prolapse.  POSTOPERATIVE DIAGNOSES: 1. Genuine stress incontinence. 2. Incomplete vaginal prolapse.  PROCEDURE:  TVT Exact mid urethral sling, cystoscopy, anterior and posterior colporrhaphy.  SURGEON:  Randye Lobo, MD  ASSISTANT:  Annamaria Boots, MD  ANESTHESIA:  Spinal epidural, Propofol sedation, local with 1% lidocaine with epinephrine 1:100,000.  IV FLUIDS:  1000 mL Ringer's lactate.  ESTIMATED BLOOD LOSS:  100 mL.  URINE OUTPUT:  400 mL.  COMPLICATIONS:  None.  INDICATIONS FOR THE PROCEDURE:  The patient is a 52 year old, gravida 59, para 2-0-4-2 Caucasian female, who presents with urinary incontinence with coughing, laughing, and sexual activity.  The patient was also noted to have a first-degree cystocele and a first-degree rectocele. The patient had preoperative urodynamic testing which confirmed the presence of genuine stress incontinence.  She is requesting surgical treatment of her prolapse and incontinence and a plan is made to proceed with a TVT Exact mid urethral sling with cystoscopy, and anterior and posterior colporrhaphy.  Risks, benefits, and alternatives have been reviewed with the patient who wishes to proceed.  FINDINGS:  Examination under anesthesia revealed a second-degree Cystocele and second degree rectocele.  The uterus and cervix were surgically absent. There was good support of the vaginal apex.   Cystoscopy demonstrated the bladder to be intact throughout 360 degrees. The patient had a normal bladder dome, trigone, and urethra.  There was no  evidence of a foreign body in the bladder or the urethra.  The ureters were noted to be patent bilaterally.  SPECIMENS:  None.  DESCRIPTION OF PROCEDURE:  The patient was reidentified in the preoperative hold area.  She did receive cefotetan 2 g IV and she received TED hose and PAS stockings for DVT prophylaxis.  In the operating room, the patient received a spinal epidural.  She was then placed in the dorsal lithotomy position in Sheridan stirrups.  The patient did receive some propofol for sedation.  The lower abdomen, vagina, and perineum were sterilely prepped and draped.  The patient was examined under anesthesia.  A Foley catheter was placed inside the bladder and left to gravity drainage throughout and at the termination of the procedure.  A weighted speculum was placed inside the vagina initially.  Allis clamps were used to mark the anterior vaginal mucosa from 1 cm below the urethral meatus to the vaginal cuff.  The anterior mucosa was injected locally with 1% lidocaine with epinephrine 1:100,000.  The mucosa was incised vertically in the midline with a scalpel.  A combination of sharp and blunt dissection were used to dissect the bladder and some vaginal tissue off of the overlying bladder bilaterally.  The dissection was carried back to the level of the pubic rami and down to the level of the vaginal cuff.  Hemostasis was also created with monopolar cautery.  A 1 cm suprapubic incisions were then created 2 cm to the right and left of the midline.  The Foley catheter was removed and a urethral obturator covered by a Foley catheter was then placed inside the bladder. The TVT Exact was performed in a bottom up standard fashion.  The urethra was deflected and then the needle introducer was placed through the right endopelvic fascia and then up through the right suprapubic incision without difficulty.  The same procedure was performed on the patient's left-hand side after  the urethra was deflected properly.  Cystoscopy was performed at this time and the findings are as noted above.  The Foley catheter was then placed inside the bladder and the bladder was completely drained of cystoscopic fluid.  The sling was brought up through the 2 suprapubic incisions bilaterally.  The sling was adjusted in its position and then a Kelly clamp was placed between the sling and the urethra as the plastic sheaths were removed.  The sling was noted to be in excellent position.  Excess sling was trimmed suprapubically.  The cystocele was then reduced by placing vertical mattress sutures, which began as 2-0 Vicryl near the top of the repair and transitioned down to 0 Vicryl for the remainder of the cystocele repair.  Excess vaginal mucosa was then trimmed and the anterior vaginal mucosa was closed with a running lock suture of 2-0 Vicryl.  There was some oozing noted from the entrance site of the sling into the right endopelvic fascia and space of Retzius and this was treated with Surgiflo prior to final closure of the anterior vaginal mucosa. Hemostasis was then good.  The posterior colporrhaphy was performed at this time.  Allis clamps were used to mark the perineal body and the posterior vaginal mucosa. The perineal body and the posterior vaginal mucosa were then injected locally with 1% lidocaine with 1:200,000 of epinephrine.  A triangular wedge of epithelium was excised from the perineal body and the posterior vaginal mucosa was incised vertically in the midline with Metzenbaum scissors.  Sharp and blunt dissection were used to dissect the perirectal fascia off of the overlying vaginal mucosa.  Rectal exam confirmed that the rectocele extended all the way up to the vaginal apex.  There was no evidence of an enterocele.  The reduction of the rectocele began by placing a pursestring suture of 2-0 Vicryl at the top of the repair.  Eventually this was oversewn  by placing vertical mattress sutures of 2-0 Vicryl and then 1 simple sutures of 2-0 Vicryl to cover over a small defect at the top right corner.  The remainder of the rectocele repair was then performed using vertical mattress sutures of 0 Vicryl.  Excess vaginal mucosa was trimmed at this time and the posterior vaginal mucosa was then closed with a running locked suture of 2-0 Vicryl down to the hymen.  A crown stitch of 0 Vicryl was placed along the perineal body.  The remainder of the superficial transverse perineal muscles were then repaired by a running suture of 2-0 Vicryl which then continued up the perineum in a subcuticular fashion for an episiotomy repair.  The suprapubic incisions were closed with subcuticular sutures of 3-0 Vicryl followed by Dermabond.    A packing of Estrace was then placed inside the vagina.  Final exam of the rectum documented the absence of sutures.  This concluded the patient's procedure.  There were no complications. All needle, instrument, and sponge counts were correct.  The patient was  escorted to the recovery room in stable and awake condition.      Randye Lobo, M.D.     BES/MEDQ  D:  04/05/2013  T:  04/05/2013  Job:  [id number]

## 2013-04-05 NOTE — Progress Notes (Signed)
Update History and Physical  No marked change in status since office preop visit. Patient examined.   OK to proceed.

## 2013-04-05 NOTE — Transfer of Care (Signed)
Immediate Anesthesia Transfer of Care Note  Patient: [name]  Procedure(s) Performed: Procedure(s) with comments: ANTERIOR (CYSTOCELE) AND POSTERIOR REPAIR (RECTOCELE) (N/A) TRANSVAGINAL TAPE (TVT) PROCEDURE WITH CYSTO (N/A) - site- vagina and urethra  Patient Location: PACU  Anesthesia Type:Spinal and Epidural  Level of Consciousness: awake, alert  and oriented  Airway & Oxygen Therapy: Patient Spontanous Breathing and Patient connected to nasal cannula oxygen  Post-op Assessment: Report given to PACU RN and Post -op Vital signs reviewed and stable  Post vital signs: Reviewed  Complications: No apparent anesthesia complications

## 2013-04-05 NOTE — Anesthesia Procedure Notes (Signed)
Spinal  Patient location during procedure: OR Start time: 04/05/2013 7:37 AM Staffing Anesthesiologist: Tiffannie Sloss A. Performed by: anesthesiologist  Preanesthetic Checklist Completed: patient identified, site marked, surgical consent, pre-op evaluation, timeout performed, IV checked, risks and benefits discussed and monitors and equipment checked Spinal Block Patient position: sitting Prep: site prepped and draped and DuraPrep Patient monitoring: heart rate, cardiac monitor, continuous pulse ox and blood pressure Approach: midline Location: L4-5 Injection technique: single-shot Needle Needle type: Sprotte and Tuohy  Needle gauge: 24 G Needle length: 12.7 cm Needle insertion depth: 5 cm Catheter type: closed end flexible Catheter size: 19 g Catheter at skin depth: 10 cm Assessment Sensory level: T8 Additional Notes Epidural performed. Spinal performed through epidural needle. CSF clear, free flow, no heme or paresthesias. Spinal needle withdrawn. Attempt to thread epidural met with resistance and heme noted in epidural catheter. Epidural needle repositioned and catheter threaded with ease. No heme aspirated. Epidural flushed with 2ml of PF NS. Sterile dressing applied. Pt. Placed supine. Adequate sensory level. Patient tolerated procedure well.

## 2013-04-06 ENCOUNTER — Encounter (HOSPITAL_COMMUNITY): Payer: Self-pay | Admitting: Obstetrics and Gynecology

## 2013-04-06 DIAGNOSIS — N993 Prolapse of vaginal vault after hysterectomy: Secondary | ICD-10-CM | POA: Diagnosis not present

## 2013-04-06 LAB — BASIC METABOLIC PANEL
BUN: 12 mg/dL (ref 6–23)
CO2: 28 mEq/L (ref 19–32)
Calcium: 9.6 mg/dL (ref 8.4–10.5)
Chloride: 102 mEq/L (ref 96–112)
Creatinine, Ser: 0.61 mg/dL (ref 0.50–1.10)
Glucose, Bld: 139 mg/dL — ABNORMAL HIGH (ref 70–99)
Sodium: 139 mEq/L (ref 135–145)

## 2013-04-06 LAB — CBC
MCH: 29.5 pg (ref 26.0–34.0)
MCV: 88.1 fL (ref 78.0–100.0)
Platelets: 198 10*3/uL (ref 150–400)
RBC: 4.47 MIL/uL (ref 3.87–5.11)
RDW: 12.6 % (ref 11.5–15.5)

## 2013-04-06 NOTE — Progress Notes (Signed)
Vaginal packing removed... Moderate amount of reddish-brown drainage noted throughout... Pt tolerated well.. Pad replaced after packing removed... Will continue to monitor pt.

## 2013-04-06 NOTE — Discharge Summary (Signed)
Physician Discharge Summary  Patient ID: [name] MRN: [id number] DOB/AGE: 1960-07-27 52 y.o.  Admit date: 04/05/2013 Discharge date: 04/06/2013  Admission Diagnoses:  Discharge Diagnoses:  Active Problems:   * No active hospital problems. *   Discharged Condition: good  Hospital Course:  The patient was admitted for overnight observation following a TVT Exact midurethral sling, cystoscopy, and anterior and posterior colporrhaphy which was performed without complication on 04/05/13.  The patient's post op course was unremarkable.  She had good control of her pain with Percocet and Toradol post op.  She tolerated a regular diet on post op day zero. She ambulated on post op day zero.  On post op day number one, her vaginal packing and foley catheter were removed.  She voided spontaneously and had PVRs of less than 100 twice, so she was discharged to home without a foley.  She had no significant fevers during her hospitalization.  Her incisions remained intact, and she had minimal to moderate vaginal bleeding.   Consults: None  Significant Diagnostic Studies: labs:  Hgb 13.2, WBC 12.5.  Treatments: surgery:  TVT Exact midurethral sling, cystoscopy, and anterior and posterior colporrhaphy   Discharge Exam: Blood pressure 139/84, pulse 101, temperature 97.7 F (36.5 C), temperature source Oral, resp. rate 18, height 5\' 11"  (1.803 m), weight 187 lb (84.823 kg), last menstrual period 02/26/2010, SpO2 99.00%. General appearance: alert GI: soft, non-tender; bowel sounds normal; no masses,  no organomegaly Incision/Wound: clean, dry, intact. Vaginal bleeding - moderate after packing removed.   Disposition: 01-Home or Self Care   Future Appointments Provider Department Dept Phone   04/13/2013 1:30 PM Melony Overly, MD [facility] [phone]   05/18/2013 10:30 AM Melony Overly, MD [facility] [phone]   02/08/2014 9:30 AM Melony Overly,  MD [facility] [phone]       Medication List    STOP taking these medications       HYDROcodone-acetaminophen 5-325 MG per tablet  Commonly known as:  NORCO/VICODIN     ibuprofen 800 MG tablet  Commonly known as:  ADVIL,MOTRIN     promethazine 25 MG tablet  Commonly known as:  PHENERGAN     traMADol 50 MG tablet  Commonly known as:  ULTRAM      TAKE these medications       albuterol 108 (90 BASE) MCG/ACT inhaler  Commonly known as:  PROVENTIL HFA;VENTOLIN HFA  Inhale 2 puffs into the lungs daily as needed for wheezing or shortness of breath (rescue inhaler).     bisoprolol 5 MG tablet  Commonly known as:  ZEBETA  Take 2.5 mg by mouth daily.     CELEBREX 200 MG capsule  Generic drug:  celecoxib  Take 200 mg by mouth daily.     cetirizine 10 MG tablet  Commonly known as:  ZYRTEC  Take 10 mg by mouth daily.     clonazePAM 0.5 MG tablet  Commonly known as:  KLONOPIN  Take 0.5 mg by mouth as needed for anxiety.     DULoxetine 60 MG capsule  Commonly known as:  CYMBALTA  Take 60 mg by mouth daily.     EPIPEN 2-PAK 0.3 mg/0.3 mL Soaj injection  Generic drug:  EPINEPHrine  Inject 0.3 mg into the muscle as needed.     levothyroxine 75 MCG tablet  Commonly known as:  SYNTHROID, LEVOTHROID  Take 75 mcg by mouth daily.     minocycline 50 MG  capsule  Commonly known as:  MINOCIN,DYNACIN  Take 50 mg by mouth daily.     mometasone 50 MCG/ACT nasal spray  Commonly known as:  NASONEX  Place 1 spray into the nose daily.     multivitamin capsule  Take 1 capsule by mouth daily.     oxyCODONE-acetaminophen 5-325 MG per tablet  Commonly known as:  PERCOCET  Take 2 tablets by mouth every 4 (four) hours as needed. use only as much as needed to relieve pain     PROBIOTIC FORMULA Caps  Take 1 capsule by mouth daily.     QVAR 80 MCG/ACT inhaler  Generic drug:  beclomethasone  Inhale 2 puffs into the lungs 2 (two) times daily.     SINGULAIR 10  MG tablet  Generic drug:  montelukast  Take 10 mg by mouth at bedtime.     traZODone 50 MG tablet  Commonly known as:  DESYREL  Take 150 mg by mouth at bedtime.       Instructions and precautions reviewed in verbal and written form. Patient understands restrictions of driving, physical and sexual activity. Follow up next week.  Signed: Annamaria Helling 04/06/2013, 9:25 AM

## 2013-04-06 NOTE — Progress Notes (Signed)
1 Day Post-Op Procedure(s) (LRB): ANTERIOR (CYSTOCELE) AND POSTERIOR REPAIR (RECTOCELE) (N/A) TRANSVAGINAL TAPE (TVT) PROCEDURE WITH CYSTO (N/A)  Subjective: Patient reports vomiting and tolerating PO. Ambulating. Good pain control. Already has prescriptions for pain medication.    Objective: I have reviewed patient's vital signs, intake and output and labs. PVRs under 100 twice.  General: alert GI: soft, non-tender; bowel sounds normal; no masses,  no organomegaly Vaginal Bleeding: moderate Suprapubic incisions intact, mildly tender.  Assessment: s/p Procedure(s) with comments: ANTERIOR (CYSTOCELE) AND POSTERIOR REPAIR (RECTOCELE) (N/A) TRANSVAGINAL TAPE (TVT) PROCEDURE WITH CYSTO (N/A) - site- vagina and urethra: progressing well Voiding on own with small residual volumes.  Plan: Discharge home  Instructions and precautions given. Will follow up next week in office. Has prescriptions for pain medication. Surgical procedure and findings have b  LOS: 1 day    [name] 04/06/2013, 9:13 AM

## 2013-04-06 NOTE — Anesthesia Postprocedure Evaluation (Signed)
  Anesthesia Post-op Note Anesthesia Post Note  Patient: [name]  Procedure(s) Performed: Procedure(s) (LRB): ANTERIOR (CYSTOCELE) AND POSTERIOR REPAIR (RECTOCELE) (N/A) TRANSVAGINAL TAPE (TVT) PROCEDURE WITH CYSTO (N/A)  Anesthesia type: Spinal  Patient location: PACU  Post pain: Pain level controlled  Post assessment: Post-op Vital signs reviewed  Post vital signs: Reviewed  Level of consciousness: awake  Complications: No apparent anesthesia complications

## 2013-04-06 NOTE — Anesthesia Postprocedure Evaluation (Signed)
Anesthesia Post Note  Patient: [name]  Procedure(s) Performed: Procedure(s) (LRB): ANTERIOR (CYSTOCELE) AND POSTERIOR REPAIR (RECTOCELE) (N/A) TRANSVAGINAL TAPE (TVT) PROCEDURE WITH CYSTO (N/A)  Anesthesia type: Epidural/SAB  Patient location: [facility]  Post pain: Pain level controlled  Post assessment: Post-op Vital signs reviewed  Last Vitals:  Filed Vitals:   04/06/13 0658  BP: 139/84  Pulse:   Temp:   Resp:     Post vital signs: Reviewed  Level of consciousness:alert  Complications: No apparent anesthesia complications

## 2013-04-06 NOTE — Progress Notes (Signed)
Pt is discharged in the care of husband. Downstairs per ambulatory, with N.T. Escort.Stable. States ambulatory pain is bettter now. Discharge instrutions with Rx were given to pt. Peri [place] iinstructions Questions asked and answered,.Abdominal laapsites are clean and dry.        Marland Kitchen

## 2013-04-13 ENCOUNTER — Encounter: Payer: Self-pay | Admitting: Obstetrics and Gynecology

## 2013-04-13 ENCOUNTER — Ambulatory Visit (INDEPENDENT_AMBULATORY_CARE_PROVIDER_SITE_OTHER): Payer: BC Managed Care – PPO | Admitting: Obstetrics and Gynecology

## 2013-04-13 VITALS — BP 132/80 | HR 70 | Ht 71.0 in | Wt 190.5 lb

## 2013-04-13 DIAGNOSIS — Z9889 Other specified postprocedural states: Secondary | ICD-10-CM

## 2013-04-13 MED ORDER — OXYCODONE-ACETAMINOPHEN 5-325 MG PO TABS: 2.0000 | ORAL_TABLET | ORAL | Status: DC | PRN

## 2013-04-13 NOTE — Progress Notes (Signed)
Patient ID: [name], female   DOB: 03-14-1961, 52 y.o.   MRN: [id number]   Subjective  Here for incision checks. Status post anterior and posterior colporrhaphy with TVT Exact and cystoscopy.  Increased activity today, so more sore.  Has not been getting out of the bed or house much.  Not much bleeding at all.  Diminishing narcotic use, but has only 2 more left.  Persistent headache since left the hospital.  Frontal headache.  Photophobia.  No nausea or vomiting.  Feels much better today. Position makes no difference for the headaches.  Some cough.    Crying spells.  Sad about family not being here for Christmas. Has a therapist and PCP caring for her.  Denies concern about her safety or harming herself.   Using Miralax and has had two bowel movements since surgery.  Also used Colace for constipation.  Voiding well.  Objective  Bp 132/80   P 70  Pelvic - SP incisions - intact.  Digital vaginal exam - sutures intact.  Sling protected.  No masses.  Nontender.  Assessment  Doing well post op overall. Situational stress/depression symptoms.  Plan  Rx for percocet 5/325 mg.  Dispense #10.  RF zero.  See Epic. I encouraged increased ambulation to diminish risk of DVT and improve her spirit.  Patient is on Cymbalta and Klonipin. Will follow up with therapist or PCP prn for depression symptoms.  Follow up in 4 weeks for next post op visit.

## 2013-04-13 NOTE — Patient Instructions (Signed)
Please call if you have worsening of your headache or increased crying episodes.

## 2013-04-14 ENCOUNTER — Ambulatory Visit: Payer: Self-pay | Admitting: Obstetrics and Gynecology

## 2013-04-14 ENCOUNTER — Other Ambulatory Visit: Payer: Self-pay

## 2013-05-02 ENCOUNTER — Encounter: Payer: 59 | Admitting: Internal Medicine

## 2013-05-02 ENCOUNTER — Telehealth: Payer: Self-pay | Admitting: Obstetrics and Gynecology

## 2013-05-02 NOTE — Telephone Encounter (Signed)
ibuprofen (ADVIL,MOTRIN) 800 MG tablet  Take 1 tablet by mouth every 6 (six) hours. , Last Dose: Not Recorded   Karin Golden [place] [phone]

## 2013-05-03 ENCOUNTER — Other Ambulatory Visit: Payer: Self-pay | Admitting: Obstetrics and Gynecology

## 2013-05-04 NOTE — Telephone Encounter (Signed)
Last Refilled: 03/30/2013 #30/0 refills  AEX: 02/07/13   Please advise.

## 2013-05-04 NOTE — Telephone Encounter (Signed)
Rx was sent by Dr.Miller left message on husband's cell phone that rx had been sent

## 2013-05-18 ENCOUNTER — Telehealth: Payer: Self-pay | Admitting: Obstetrics and Gynecology

## 2013-05-18 ENCOUNTER — Ambulatory Visit (INDEPENDENT_AMBULATORY_CARE_PROVIDER_SITE_OTHER): Payer: BC Managed Care – PPO | Admitting: Obstetrics and Gynecology

## 2013-05-18 ENCOUNTER — Encounter: Payer: Self-pay | Admitting: Obstetrics and Gynecology

## 2013-05-18 VITALS — BP 130/80 | HR 64 | Resp 16 | Ht 71.0 in | Wt 196.0 lb

## 2013-05-18 DIAGNOSIS — N83202 Unspecified ovarian cyst, left side: Secondary | ICD-10-CM

## 2013-05-18 DIAGNOSIS — N83209 Unspecified ovarian cyst, unspecified side: Secondary | ICD-10-CM

## 2013-05-18 NOTE — Telephone Encounter (Signed)
[name], can you assist this patient with scheduling.  Thank you.

## 2013-05-18 NOTE — Telephone Encounter (Signed)
Called patient/ scheduled 6 week follow up [place],  Patient would also like to know if  post op recheck can also be done during this visit? Thanks, Gabriel Cirri

## 2013-05-18 NOTE — Telephone Encounter (Signed)
Dr Quincy Simmonds would like pt to schedule an ultrasound 6 weeks from today

## 2013-05-18 NOTE — Progress Notes (Signed)
Patient ID: [name], female   DOB: 12-27-1960, 53 y.o.   MRN: [id number]  Subjective  Patient is here for 6 week post op check. Status post anterior and posterior colporrhaphy with native tissue repair, TVT Exact and cystoscopy on 04/05/13. Anxious to increase activity. Still taking Miralax and Colace for stool softeners.   This is effective for patient.  Bladder is doing well.  Double voiding at times.   No bleeding. Fatigue when has increased activity.   Fell down two steps today.    Boot got caught.   Fell on the right hip.    Objective  Pelvic  Normal external genitalia and urethra.  Vaginal sutures partially present. Sling nonvisible. Excellent support. No masses or tenderness.  Assessment  History of complex left ovarian cyst. Status post anterior and posterior colporrhaphy with TVT and cysto.  Progressing well.   Plan  Follow up in 6 weeks for final post op visit and pelvic ultrasound. Continue decreased activity and pelvic rest.

## 2013-06-29 ENCOUNTER — Telehealth: Payer: Self-pay | Admitting: Obstetrics and Gynecology

## 2013-06-29 NOTE — Telephone Encounter (Signed)
Patient canceled her appt for tomorrow for ultrasound and post op consult with silva can you please reschedule

## 2013-06-30 ENCOUNTER — Other Ambulatory Visit: Payer: BC Managed Care – PPO

## 2013-06-30 ENCOUNTER — Other Ambulatory Visit: Payer: BC Managed Care – PPO | Admitting: Obstetrics and Gynecology

## 2013-06-30 NOTE — Telephone Encounter (Signed)
Spoke with patient. R/S PUS for 07/07/13 with Dr. Quincy Simmonds.  Sent for re-precert as patient has new plan/policy.  Pelvic U/S scheduled and patient aware/agreeable to time.  Patient verbalized understanding of the U/S appointment cancellation policy. Advised will need to cancel within 72 business hours (3 business days) or will have $100.00 no show fee placed to account.  Patient cancelled prior for weather.   Routing to provider for final review. Patient agreeable to disposition. Will close encounter

## 2013-07-07 ENCOUNTER — Other Ambulatory Visit: Payer: BC Managed Care – PPO

## 2013-07-07 ENCOUNTER — Telehealth: Payer: Self-pay | Admitting: Obstetrics and Gynecology

## 2013-07-07 ENCOUNTER — Other Ambulatory Visit: Payer: BC Managed Care – PPO | Admitting: Obstetrics and Gynecology

## 2013-07-07 NOTE — Telephone Encounter (Signed)
Called patient and left message to cancel her appointment for an ultrasound today due to inclement weather. Please call patient back to reschedule. I also let her know we are not opening until 10:00 AM tomorrow.

## 2013-07-08 NOTE — Telephone Encounter (Signed)
Left message for patient to call back to rescheduled pus

## 2013-07-08 NOTE — Telephone Encounter (Signed)
Patient is calling sabrina back °

## 2013-07-28 ENCOUNTER — Ambulatory Visit (INDEPENDENT_AMBULATORY_CARE_PROVIDER_SITE_OTHER): Payer: BC Managed Care – PPO | Admitting: Obstetrics and Gynecology

## 2013-07-28 ENCOUNTER — Ambulatory Visit (INDEPENDENT_AMBULATORY_CARE_PROVIDER_SITE_OTHER): Payer: BC Managed Care – PPO

## 2013-07-28 ENCOUNTER — Encounter: Payer: Self-pay | Admitting: Obstetrics and Gynecology

## 2013-07-28 VITALS — BP 124/70 | HR 60 | Ht 71.0 in | Wt 195.0 lb

## 2013-07-28 DIAGNOSIS — N83209 Unspecified ovarian cyst, unspecified side: Secondary | ICD-10-CM

## 2013-07-28 DIAGNOSIS — IMO0002 Reserved for concepts with insufficient information to code with codable children: Secondary | ICD-10-CM

## 2013-07-28 DIAGNOSIS — N83202 Unspecified ovarian cyst, left side: Secondary | ICD-10-CM

## 2013-07-28 LAB — POCT URINALYSIS DIPSTICK
Bilirubin, UA: NEGATIVE
Blood, UA: NEGATIVE
GLUCOSE UA: NEGATIVE
Ketones, UA: NEGATIVE
LEUKOCYTES UA: NEGATIVE
Nitrite, UA: NEGATIVE
PROTEIN UA: NEGATIVE
Urobilinogen, UA: NEGATIVE
pH, UA: 5

## 2013-07-28 NOTE — Progress Notes (Signed)
Subjective  Patient is here for follow up of a complex left ovarian cyst, consistent with a hemorrhagic cyst 2.32 cm.  Prior CA 125 8.5.  Status post anterior and posterior colporrhaphy and TVT on 04/05/13. Has some phamtom shooting pains. Occurs a lot after intercourse.  Shoots up the left side. Some discomfort with intercourse.   Objective  Pelvic exam Suprapubic region with well-healed incisions.   Normal external genitalia and urethra. Vagina - no lesions.  Good support.  Sling not visible or palpable.   No masses.  Nontender.   Ultrasound today shows resolution of left ovarian cyst. Right ovary not seen.  Uterus absent.  No free fluid.     Assessment  Left ovarian cyst - resolved.  Dsypareunia.  I suspect atrophic vaginal changes.  Plan  Try vitamin E suppositories over the counter. Use cooking oils for lubrication during intercourse. Check urine dip:  PH 5, negative.  Follow up for annual exam in Sept. 2015.   After visit summary to patient.   15 minutes face to face time of which over 50% was spent in counseling.

## 2013-07-28 NOTE — Patient Instructions (Signed)
I will see you September 30 for your annual examination.

## 2013-12-21 ENCOUNTER — Other Ambulatory Visit: Payer: Self-pay | Admitting: Orthopedic Surgery

## 2013-12-21 DIAGNOSIS — M542 Cervicalgia: Secondary | ICD-10-CM

## 2013-12-27 ENCOUNTER — Other Ambulatory Visit: Payer: Self-pay | Admitting: Orthopedic Surgery

## 2013-12-27 DIAGNOSIS — Z139 Encounter for screening, unspecified: Secondary | ICD-10-CM

## 2013-12-28 ENCOUNTER — Ambulatory Visit
Admission: RE | Admit: 2013-12-28 | Discharge: 2013-12-28 | Disposition: A | Discharge status: Home / Self Care | Payer: BC Managed Care – PPO | Source: Ambulatory Visit | Attending: Orthopedic Surgery | Admitting: Orthopedic Surgery

## 2013-12-28 DIAGNOSIS — Z139 Encounter for screening, unspecified: Secondary | ICD-10-CM

## 2013-12-30 ENCOUNTER — Ambulatory Visit
Admission: RE | Admit: 2013-12-30 | Discharge: 2013-12-30 | Disposition: A | Discharge status: Home / Self Care | Payer: BC Managed Care – PPO | Source: Ambulatory Visit | Attending: Orthopedic Surgery | Admitting: Orthopedic Surgery

## 2013-12-30 ENCOUNTER — Other Ambulatory Visit: Payer: BC Managed Care – PPO

## 2013-12-30 DIAGNOSIS — M542 Cervicalgia: Secondary | ICD-10-CM

## 2014-01-04 ENCOUNTER — Encounter: Payer: Self-pay | Admitting: Obstetrics and Gynecology

## 2014-02-08 ENCOUNTER — Ambulatory Visit: Payer: BC Managed Care – PPO | Admitting: Obstetrics and Gynecology

## 2014-02-13 ENCOUNTER — Ambulatory Visit (INDEPENDENT_AMBULATORY_CARE_PROVIDER_SITE_OTHER): Payer: BC Managed Care – PPO | Admitting: Obstetrics and Gynecology

## 2014-02-13 ENCOUNTER — Encounter: Payer: Self-pay | Admitting: Obstetrics and Gynecology

## 2014-02-13 VITALS — BP 136/74 | HR 70 | Resp 18 | Ht 70.0 in | Wt 201.2 lb

## 2014-02-13 DIAGNOSIS — Z01419 Encounter for gynecological examination (general) (routine) without abnormal findings: Secondary | ICD-10-CM

## 2014-02-13 DIAGNOSIS — Z0000 Encounter for general adult medical examination without abnormal findings: Secondary | ICD-10-CM

## 2014-02-13 DIAGNOSIS — Z13820 Encounter for screening for osteoporosis: Secondary | ICD-10-CM

## 2014-02-13 DIAGNOSIS — Z1211 Encounter for screening for malignant neoplasm of colon: Secondary | ICD-10-CM

## 2014-02-13 LAB — POCT URINALYSIS DIPSTICK
BILIRUBIN UA: NEGATIVE
GLUCOSE UA: NEGATIVE
Ketones, UA: NEGATIVE
LEUKOCYTES UA: NEGATIVE
NITRITE UA: NEGATIVE
Protein, UA: NEGATIVE
RBC UA: NEGATIVE
UROBILINOGEN UA: NEGATIVE
pH, UA: 5

## 2014-02-13 NOTE — Progress Notes (Signed)
Patient ID: [name], female   DOB: Dec 16, 1960, 53 y.o.   MRN: [id number] 53 y.o. [id number] MarriedCaucasianF here for annual exam.   Has a herniated disc of cervical spine. Some arm numbness.  Upset with weight gain due to steroids.   Having stress fractures of the feet.  No prior bone density study.   In marital counseling.   Using water based product for vaginal lubrication.  Not enough.   Still with constipation.  Euthyroid.  Has not had colonoscopy.   Good bladder control.   Patient's last menstrual period was 02/26/2010.          Sexually active: Yes.  (female partner)  The current method of family planning is hysterectomy.    Exercising: Yes.    Yoga Smoker:  no  Health Maintenance: Pap:  10-01-09 wnl History of abnormal Pap:  Yes 2006 MMG:  03-18-13 fibroglandular density:wnl:[facility] Colonoscopy: never  BMD:   n/a TDaP:  09/2009 Screening Labs: PCP, Hb today: PCP, Urine today: Neg   reports that she has never smoked. She has never used smokeless tobacco. She reports that she drinks about 1.5 ounces of alcohol per week. She reports that she does not use illicit drugs.  Past Medical History  Diagnosis Date  . Asthma   . Hypertension   . Anxiety   . Depression   . Seasonal allergies   . Thyroid disease     hypothyroidism  . Hx of abnormal Pap smear 2006    no treatment to cervix  . Heart murmur   . Panic attack 03/04/13  . Headache(784.0)   . Hypothyroidism   . Complication of anesthesia 2000    aspirated- had been sick prior to surgery- asthma  . Ruptured cervical disc   . Arthritis     hips/back    Past Surgical History  Procedure Laterality Date  . Abdominal hysterectomy  02/2010    Robotic-[facility]--Dr. Joan Flores  . Endometrial biopsy  10-10-09    Benign fragments of endometrial polyp  . Inguinal hernia repair      as an infant  . Liposuction  2006    thighs, chin  . Anterior and posterior repair N/A 04/05/2013    Procedure: ANTERIOR  (CYSTOCELE) AND POSTERIOR REPAIR (RECTOCELE);  Surgeon: Arloa Koh, MD;  Location: [facility];  Service: Gynecology;  Laterality: N/A;  . Bladder suspension N/A 04/05/2013    Procedure: TRANSVAGINAL TAPE (TVT) PROCEDURE WITH CYSTO;  Surgeon: Arloa Koh, MD;  Location: [facility];  Service: Gynecology;  Laterality: N/A;  site- vagina and urethra    Current Outpatient Prescriptions  Medication Sig Dispense Refill  . ADVIL 200 MG tablet Take 200 mg by mouth 2 (two) times daily.      Marland Kitchen albuterol (PROVENTIL HFA;VENTOLIN HFA) 108 (90 BASE) MCG/ACT inhaler Inhale 2 puffs into the lungs daily as needed for wheezing or shortness of breath (rescue inhaler).      . bisoprolol (ZEBETA) 5 MG tablet Take 2.5 mg by mouth daily.       . CELEBREX 200 MG capsule Take 200 mg by mouth daily.      . cetirizine (ZYRTEC) 10 MG tablet Take 10 mg by mouth daily.        . clonazePAM (KLONOPIN) 0.5 MG tablet Take 0.5 mg by mouth as needed for anxiety.      . DULoxetine (CYMBALTA) 60 MG capsule Take 60 mg by mouth daily.        Marland Kitchen EPIPEN 2-PAK 0.3  MG/0.3ML SOAJ injection Inject 0.3 mg into the muscle as needed.      Marland Kitchen levothyroxine (SYNTHROID, LEVOTHROID) 75 MCG tablet Take 75 mcg by mouth daily.        . minocycline (MINOCIN,DYNACIN) 50 MG capsule Take 50 mg by mouth daily.      . mometasone (NASONEX) 50 MCG/ACT nasal spray Place 1 spray into the nose daily.       . montelukast (SINGULAIR) 10 MG tablet Take 10 mg by mouth at bedtime.      . Multiple Vitamin (MULTIVITAMIN) capsule Take 1 capsule by mouth daily.      . Probiotic Product (PROBIOTIC FORMULA) CAPS Take 1 capsule by mouth daily.        . promethazine (PHENERGAN) 25 MG tablet Take 1 tablet by mouth as needed.      Marland Kitchen QVAR 80 MCG/ACT inhaler Inhale 2 puffs into the lungs 2 (two) times daily.       . traMADol (ULTRAM) 50 MG tablet Take 1 tablet by mouth at bedtime.      . traZODone (DESYREL) 50 MG tablet Take 150 mg by mouth at bedtime.       Marland Kitchen  HYDROcodone-acetaminophen (NORCO/VICODIN) 5-325 MG per tablet Take 1 tablet by mouth as needed.      . polyethylene glycol (MIRALAX / GLYCOLAX) packet Take 17 g by mouth daily.       No current facility-administered medications for this visit.    Family History  Problem Relation Age of Onset  . Brain cancer Mother     dec with brain & lung CA  . Lung cancer Mother   . Diabetes Maternal Grandmother   . Diabetes Maternal Grandfather   . Diabetes Paternal Grandmother   . Diabetes Paternal Grandfather   . Bladder Cancer Father     dec age 33  . Prostate cancer Father     ROS:  Pertinent items are noted in HPI.  Otherwise, a comprehensive ROS was negative.  Exam:   BP 136/74  Pulse 70  Resp 18  Ht 5\' 10"  (1.778 m)  Wt 201 lb 3.2 oz (91.264 kg)  BMI 28.87 kg/m2  LMP 02/26/2010  Weight change: @WEIGHTCHANGE @ Height:   Height: 5\' 10"  (177.8 cm)  Ht Readings from Last 3 Encounters:  02/13/14 5\' 10"  (1.778 m)  07/28/13 5\' 11"  (1.803 m)  05/18/13 5\' 11"  (1.803 m)    General appearance: alert, cooperative and appears stated age Head: Normocephalic, without obvious abnormality, atraumatic Neck: no adenopathy, supple, symmetrical, trachea midline and thyroid normal to inspection and palpation Lungs: clear to auscultation bilaterally Breasts: normal appearance, no masses or tenderness, Inspection negative, No nipple retraction or dimpling, No nipple discharge or bleeding, No axillary or supraclavicular adenopathy Heart: regular rate and rhythm Abdomen: soft, non-tender; bowel sounds normal; no masses,  no organomegaly Extremities: extremities normal, atraumatic, no cyanosis or edema Skin: Skin color, texture, turgor normal. No rashes or lesions Lymph nodes: Cervical, supraclavicular, and axillary nodes normal. No abnormal inguinal nodes palpated Neurologic: Grossly normal   Pelvic: External genitalia:  no lesions              Urethra:  normal appearing urethra with no masses,  tenderness or lesions              Bartholins and Skenes: normal                 Vagina: normal appearing vagina with normal color and discharge, no lesions  Cervix: absent              Pap taken: No. Bimanual Exam:  Uterus:  uterus absent              Adnexa: normal adnexa               Rectovaginal: Confirms               Anus:  normal sphincter tone, no lesions  A:  Well Woman with normal exam History of stress fractures.  Constipation.   P:   Mammogram yearly.  Patient will call.  pap smear not indicated.  Bone density study.  Referral for colonoscopy.  Discussed Ca/Vit D/exercise, self breast exams.   return annually or prn  An After Visit Summary was printed and given to the patient.

## 2014-02-13 NOTE — Patient Instructions (Signed)

## 2014-02-21 ENCOUNTER — Other Ambulatory Visit: Payer: Self-pay | Admitting: Neurological Surgery

## 2014-02-24 ENCOUNTER — Other Ambulatory Visit: Payer: Self-pay

## 2014-02-27 ENCOUNTER — Encounter (HOSPITAL_COMMUNITY): Payer: Self-pay | Admitting: Pharmacy Technician

## 2014-02-28 ENCOUNTER — Other Ambulatory Visit (HOSPITAL_COMMUNITY): Payer: Self-pay | Admitting: *Deleted

## 2014-02-28 NOTE — Pre-Procedure Instructions (Signed)
[name]  02/28/2014   Your procedure is scheduled on:  Friday, March 03, 2014 at 7:30 AM.   Report to [facility] Entrance "A" Admitting Office at 5:30 AM.   Call this number if you have problems the morning of surgery: [phone]   Remember:   Do not eat food or drink liquids after midnight Thursday, 03/02/14.   Take these medicines the morning of surgery with A SIP OF WATER: bisoprolol (ZEBETA), cetirizine (ZYRTEC), DULoxetine (CYMBALTA), levothyroxine (SYNTHROID, LEVOTHROID), mometasone (NASONEX), QVAR inhaler, clonazePAM (KLONOPIN) -if needed, albuterol (PROVENTIL HFA;VENTOLIN HFA) - if needed, HYDROcodone-acetaminophen (NORCO/VICODIN).  Stop Vitamins, Herbal medications and Celebrex as of today.   Do not wear jewelry, make-up or nail polish.  Do not wear lotions, powders, or perfumes. You may wear deodorant.  Do not shave 48 hours prior to surgery.   Do not bring valuables to the hospital.  [facility] is not responsible                  for any belongings or valuables.               Contacts, dentures or bridgework may not be worn into surgery.  Leave suitcase in the car. After surgery it may be brought to your room.  For patients admitted to the hospital, discharge time is determined by your                treatment team.              Special Instructions: [facility] - Preparing for Surgery  Before surgery, you can play an important role.  Because skin is not sterile, your skin needs to be as free of germs as possible.  You can reduce the number of germs on you skin by washing with CHG (chlorahexidine gluconate) soap before surgery.  CHG is an antiseptic cleaner which kills germs and bonds with the skin to continue killing germs even after washing.  Please DO NOT use if you have an allergy to CHG or antibacterial soaps.  If your skin becomes reddened/irritated stop using the CHG and inform your nurse when you arrive at Short Stay.  Do not shave  (including legs and underarms) for at least 48 hours prior to the first CHG shower.  You may shave your face.  Please follow these instructions carefully:   1.  Shower with CHG Soap the night before surgery and the                                morning of Surgery.  2.  If you choose to wash your hair, wash your hair first as usual with your       normal shampoo.  3.  After you shampoo, rinse your hair and body thoroughly to remove the                      Shampoo.  4.  Use CHG as you would any other liquid soap.  You can apply chg directly       to the skin and wash gently with scrungie or a clean washcloth.  5.  Apply the CHG Soap to your body ONLY FROM THE NECK DOWN.        Do not use on open wounds or open sores.  Avoid contact with your eyes, ears, mouth and genitals (private parts).  Wash genitals (private  parts) with your normal soap.  6.  Wash thoroughly, paying special attention to the area where your surgery        will be performed.  7.  Thoroughly rinse your body with warm water from the neck down.  8.  DO NOT shower/wash with your normal soap after using and rinsing off       the CHG Soap.  9.  Pat yourself dry with a clean towel.            10.  Wear clean pajamas.            11.  Place clean sheets on your bed the night of your first shower and do not        sleep with pets.  Day of Surgery  Do not apply any lotions the morning of surgery.  Please wear clean clothes to the hospital/surgery center.     Please read over the following fact sheets that you were given: Pain Booklet, Coughing and Deep Breathing, MRSA Information and Surgical Site Infection Prevention

## 2014-03-01 ENCOUNTER — Encounter (HOSPITAL_COMMUNITY)
Admission: RE | Admit: 2014-03-01 | Discharge: 2014-03-01 | Disposition: A | Discharge status: Home / Self Care | Payer: BC Managed Care – PPO | Source: Ambulatory Visit | Attending: Neurological Surgery | Admitting: Neurological Surgery

## 2014-03-01 ENCOUNTER — Encounter (HOSPITAL_COMMUNITY): Payer: Self-pay | Admitting: *Deleted

## 2014-03-01 DIAGNOSIS — Z79899 Other long term (current) drug therapy: Secondary | ICD-10-CM | POA: Diagnosis not present

## 2014-03-01 DIAGNOSIS — F329 Major depressive disorder, single episode, unspecified: Secondary | ICD-10-CM | POA: Diagnosis not present

## 2014-03-01 DIAGNOSIS — E039 Hypothyroidism, unspecified: Secondary | ICD-10-CM | POA: Diagnosis not present

## 2014-03-01 DIAGNOSIS — F419 Anxiety disorder, unspecified: Secondary | ICD-10-CM | POA: Diagnosis not present

## 2014-03-01 DIAGNOSIS — M4722 Other spondylosis with radiculopathy, cervical region: Secondary | ICD-10-CM | POA: Diagnosis present

## 2014-03-01 DIAGNOSIS — I10 Essential (primary) hypertension: Secondary | ICD-10-CM | POA: Diagnosis not present

## 2014-03-01 DIAGNOSIS — M1990 Unspecified osteoarthritis, unspecified site: Secondary | ICD-10-CM | POA: Diagnosis not present

## 2014-03-01 DIAGNOSIS — J45909 Unspecified asthma, uncomplicated: Secondary | ICD-10-CM | POA: Diagnosis not present

## 2014-03-01 LAB — BASIC METABOLIC PANEL WITH GFR
Anion gap: 10 (ref 5–15)
BUN: 16 mg/dL (ref 6–23)
CO2: 26 meq/L (ref 19–32)
Calcium: 9.7 mg/dL (ref 8.4–10.5)
Chloride: 103 meq/L (ref 96–112)
Creatinine, Ser: 0.66 mg/dL (ref 0.50–1.10)
GFR calc Af Amer: >90 mL/min
GFR calc non Af Amer: >90 mL/min
Glucose, Bld: 102 mg/dL — ABNORMAL HIGH (ref 70–99)
Potassium: 4.4 meq/L (ref 3.7–5.3)
Sodium: 139 meq/L (ref 137–147)

## 2014-03-01 LAB — CBC WITH DIFFERENTIAL/PLATELET
Basophils Absolute: 0 10*3/uL (ref 0.0–0.1)
Basophils Relative: 0 % (ref 0–1)
EOS PCT: 1 % (ref 0–5)
Eosinophils Absolute: 0 10*3/uL (ref 0.0–0.7)
HEMATOCRIT: 46.1 % — AB (ref 36.0–46.0)
Hemoglobin: 15.2 g/dL — ABNORMAL HIGH (ref 12.0–15.0)
LYMPHS PCT: 40 % (ref 12–46)
Lymphs Abs: 2.2 10*3/uL (ref 0.7–4.0)
MCH: 29.7 pg (ref 26.0–34.0)
MCHC: 33 g/dL (ref 30.0–36.0)
MCV: 90 fL (ref 78.0–100.0)
MONO ABS: 0.2 10*3/uL (ref 0.1–1.0)
Monocytes Relative: 4 % (ref 3–12)
Neutro Abs: 3 10*3/uL (ref 1.7–7.7)
Neutrophils Relative %: 55 % (ref 43–77)
Platelets: 238 10*3/uL (ref 150–400)
RBC: 5.12 MIL/uL — ABNORMAL HIGH (ref 3.87–5.11)
RDW: 12.9 % (ref 11.5–15.5)
WBC: 5.5 10*3/uL (ref 4.0–10.5)

## 2014-03-01 LAB — SURGICAL PCR SCREEN
MRSA, PCR: NEGATIVE
Staphylococcus aureus: NEGATIVE

## 2014-03-01 LAB — PROTIME-INR
INR: 0.99 (ref 0.00–1.49)
Prothrombin Time: 13.2 seconds (ref 11.6–15.2)

## 2014-03-01 NOTE — Progress Notes (Addendum)
PCP is Dr Briscoe Deutscher, but also sees Elta Guadeloupe the PA there. Denies seeing a Cardiologist. EKG noted in epic from 04-08-2014. Denies having a recent CXR. Denies ever having a stress test, Echo, or card cath. Reports Hx of asthma, carries an inhaler with her at all times, just in case. States that her asthma is triggered by sickness. Reports that she has been told she has a heart murmer but it is not always heard.

## 2014-03-02 ENCOUNTER — Encounter (HOSPITAL_COMMUNITY): Payer: Self-pay

## 2014-03-02 MED ORDER — DEXAMETHASONE SODIUM PHOSPHATE 10 MG/ML IJ SOLN
10.0000 mg | INTRAMUSCULAR | Status: AC
  Administered 2014-03-03: 10 mg via INTRAVENOUS
  Filled 2014-03-02: qty 1

## 2014-03-02 MED ORDER — CEFAZOLIN SODIUM-DEXTROSE 2-3 GM-% IV SOLR
2.0000 g | INTRAVENOUS | Status: AC
  Administered 2014-03-03: 2 g via INTRAVENOUS
  Filled 2014-03-02: qty 50

## 2014-03-02 NOTE — Progress Notes (Signed)
Anesthesia Chart Review:  Patient is a 53 year old female scheduled for C4-5, C5-6 ACDF  On 03/03/14 by Dr. Ronnald Ramp.  History includes ruptured cervical disc, non-smoker, asthma, SOB, HTN, murmur (heard on occasion--denies echo), anxiety/panic attack, depression, hypothyroidism, arthritis, headaches, hysterectomy, liposuction, cystocele and rectocele repair 03/2013.  She reported history of aspiration perioperatively in 2000 (had recently been sick/asthma exacerbation). PCP is Dr. Maceo Pro.   EKG on 03/29/13 showed: NSR.  Preoperative CXR and labs noted.    MRI of the c-spine 12/30/13: C4-5 disc degeneration and spondylosis with mild foraminal narrowing bilaterally. Small right-sided osteophyte at C5-6 with mild right foraminal narrowing. Small right-sided disc protrusion C6-7 not causing foraminal narrowing.  If no acute changes or acute asthma exacerbation then I would anticipate that she could proceed as planned.  George Hugh [facility] Short Stay Center/Anesthesiology Phone [phone] 03/02/2014 9:38 AM

## 2014-03-03 ENCOUNTER — Encounter (HOSPITAL_COMMUNITY): Payer: Self-pay | Admitting: *Deleted

## 2014-03-03 ENCOUNTER — Encounter (HOSPITAL_COMMUNITY)
Admission: RE | Disposition: A | Discharge status: Home / Self Care | Payer: Self-pay | Source: Ambulatory Visit | Attending: Neurological Surgery

## 2014-03-03 ENCOUNTER — Ambulatory Visit (HOSPITAL_COMMUNITY): Payer: BC Managed Care – PPO | Admitting: Anesthesiology

## 2014-03-03 ENCOUNTER — Encounter (HOSPITAL_COMMUNITY): Payer: BC Managed Care – PPO | Admitting: Vascular Surgery

## 2014-03-03 ENCOUNTER — Ambulatory Visit (HOSPITAL_COMMUNITY): Payer: BC Managed Care – PPO

## 2014-03-03 ENCOUNTER — Ambulatory Visit (HOSPITAL_COMMUNITY)
Admission: RE | Admit: 2014-03-03 | Discharge: 2014-03-04 | Disposition: A | Discharge status: Home / Self Care | Payer: BC Managed Care – PPO | Source: Ambulatory Visit | Attending: Neurological Surgery | Admitting: Neurological Surgery

## 2014-03-03 DIAGNOSIS — M1990 Unspecified osteoarthritis, unspecified site: Secondary | ICD-10-CM | POA: Insufficient documentation

## 2014-03-03 DIAGNOSIS — M479 Spondylosis, unspecified: Secondary | ICD-10-CM

## 2014-03-03 DIAGNOSIS — F419 Anxiety disorder, unspecified: Secondary | ICD-10-CM | POA: Insufficient documentation

## 2014-03-03 DIAGNOSIS — M4722 Other spondylosis with radiculopathy, cervical region: Secondary | ICD-10-CM | POA: Diagnosis not present

## 2014-03-03 DIAGNOSIS — Z79899 Other long term (current) drug therapy: Secondary | ICD-10-CM | POA: Insufficient documentation

## 2014-03-03 DIAGNOSIS — M4310 Spondylolisthesis, site unspecified: Secondary | ICD-10-CM

## 2014-03-03 DIAGNOSIS — Z981 Arthrodesis status: Secondary | ICD-10-CM

## 2014-03-03 DIAGNOSIS — I10 Essential (primary) hypertension: Secondary | ICD-10-CM | POA: Insufficient documentation

## 2014-03-03 DIAGNOSIS — M4712 Other spondylosis with myelopathy, cervical region: Secondary | ICD-10-CM

## 2014-03-03 DIAGNOSIS — E039 Hypothyroidism, unspecified: Secondary | ICD-10-CM | POA: Insufficient documentation

## 2014-03-03 DIAGNOSIS — J45909 Unspecified asthma, uncomplicated: Secondary | ICD-10-CM | POA: Insufficient documentation

## 2014-03-03 DIAGNOSIS — F329 Major depressive disorder, single episode, unspecified: Secondary | ICD-10-CM | POA: Insufficient documentation

## 2014-03-03 HISTORY — DX: Pneumonia, unspecified organism: J18.9

## 2014-03-03 HISTORY — DX: Shortness of breath: R06.02

## 2014-03-03 HISTORY — PX: ANTERIOR CERVICAL DECOMP/DISCECTOMY FUSION: SHX1161

## 2014-03-03 SURGERY — ANTERIOR CERVICAL DECOMPRESSION/DISCECTOMY FUSION 2 LEVELS
Anesthesia: General

## 2014-03-03 MED ORDER — PHENOL 1.4 % MT LIQD
1.0000 | OROMUCOSAL | Status: DC | PRN
Start: 2014-03-03 — End: 2014-03-04
  Administered 2014-03-03: 1 via OROMUCOSAL
  Filled 2014-03-03: qty 177

## 2014-03-03 MED ORDER — ACETAMINOPHEN 650 MG RE SUPP: 650.0000 mg | RECTAL | Status: DC | PRN

## 2014-03-03 MED ORDER — SUCCINYLCHOLINE CHLORIDE 20 MG/ML IJ SOLN
INTRAMUSCULAR | Status: AC
  Filled 2014-03-03: qty 1

## 2014-03-03 MED ORDER — PHENYLEPHRINE 40 MCG/ML (10ML) SYRINGE FOR IV PUSH (FOR BLOOD PRESSURE SUPPORT)
PREFILLED_SYRINGE | INTRAVENOUS | Status: AC
  Filled 2014-03-03: qty 10

## 2014-03-03 MED ORDER — ROCURONIUM BROMIDE 50 MG/5ML IV SOLN
INTRAVENOUS | Status: AC
  Filled 2014-03-03: qty 1

## 2014-03-03 MED ORDER — THROMBIN 5000 UNITS EX SOLR
CUTANEOUS | Status: DC | PRN
  Administered 2014-03-03 (×2): 5000 [IU] via TOPICAL

## 2014-03-03 MED ORDER — FENTANYL CITRATE 0.05 MG/ML IJ SOLN
INTRAMUSCULAR | Status: DC | PRN
  Administered 2014-03-03 (×2): 50 ug via INTRAVENOUS
  Administered 2014-03-03: 100 ug via INTRAVENOUS
  Administered 2014-03-03: 50 ug via INTRAVENOUS

## 2014-03-03 MED ORDER — LEVOTHYROXINE SODIUM 88 MCG PO TABS
88.0000 ug | ORAL_TABLET | Freq: Every day | ORAL | Status: DC
  Administered 2014-03-04: 88 ug via ORAL
  Filled 2014-03-03 (×2): qty 1

## 2014-03-03 MED ORDER — METHOCARBAMOL 1000 MG/10ML IJ SOLN
500.0000 mg | Freq: Four times a day (QID) | INTRAVENOUS | Status: DC | PRN
  Filled 2014-03-03: qty 5

## 2014-03-03 MED ORDER — LACTATED RINGERS IV SOLN
INTRAVENOUS | Status: DC | PRN
  Administered 2014-03-03 (×2): via INTRAVENOUS

## 2014-03-03 MED ORDER — NEOSTIGMINE METHYLSULFATE 10 MG/10ML IV SOLN
INTRAVENOUS | Status: AC
  Filled 2014-03-03: qty 1

## 2014-03-03 MED ORDER — HYDROMORPHONE HCL 1 MG/ML IJ SOLN
0.2500 mg | INTRAMUSCULAR | Status: DC | PRN
  Administered 2014-03-03 (×4): 0.5 mg via INTRAVENOUS

## 2014-03-03 MED ORDER — HEMOSTATIC AGENTS (NO CHARGE) OPTIME
TOPICAL | Status: DC | PRN
  Administered 2014-03-03: 1 via TOPICAL

## 2014-03-03 MED ORDER — 0.9 % SODIUM CHLORIDE (POUR BTL) OPTIME
TOPICAL | Status: DC | PRN
  Administered 2014-03-03: 1000 mL

## 2014-03-03 MED ORDER — CLONAZEPAM 1 MG PO TABS
1.0000 mg | ORAL_TABLET | Freq: Three times a day (TID) | ORAL | Status: DC | PRN
  Administered 2014-03-03: 1 mg via ORAL
  Filled 2014-03-03: qty 1

## 2014-03-03 MED ORDER — BUPIVACAINE HCL (PF) 0.25 % IJ SOLN
INTRAMUSCULAR | Status: DC | PRN
  Administered 2014-03-03: 6 mL

## 2014-03-03 MED ORDER — PROPOFOL 10 MG/ML IV BOLUS
INTRAVENOUS | Status: DC | PRN
  Administered 2014-03-03: 200 mg via INTRAVENOUS

## 2014-03-03 MED ORDER — ALBUTEROL SULFATE (2.5 MG/3ML) 0.083% IN NEBU: 3.0000 mL | INHALATION_SOLUTION | Freq: Every day | RESPIRATORY_TRACT | Status: DC | PRN

## 2014-03-03 MED ORDER — SODIUM CHLORIDE 0.9 % IJ SOLN
INTRAMUSCULAR | Status: AC
  Filled 2014-03-03: qty 10

## 2014-03-03 MED ORDER — EPHEDRINE SULFATE 50 MG/ML IJ SOLN
INTRAMUSCULAR | Status: AC
  Filled 2014-03-03: qty 1

## 2014-03-03 MED ORDER — HYDROMORPHONE HCL 1 MG/ML IJ SOLN
INTRAMUSCULAR | Status: AC
  Filled 2014-03-03: qty 1

## 2014-03-03 MED ORDER — FENTANYL CITRATE 0.05 MG/ML IJ SOLN
INTRAMUSCULAR | Status: AC
  Filled 2014-03-03: qty 5

## 2014-03-03 MED ORDER — MIDAZOLAM HCL 2 MG/2ML IJ SOLN
INTRAMUSCULAR | Status: AC
  Filled 2014-03-03: qty 2

## 2014-03-03 MED ORDER — CEFAZOLIN SODIUM 1-5 GM-% IV SOLN
1.0000 g | Freq: Three times a day (TID) | INTRAVENOUS | Status: AC
  Administered 2014-03-03 (×2): 1 g via INTRAVENOUS
  Filled 2014-03-03 (×2): qty 50

## 2014-03-03 MED ORDER — MONTELUKAST SODIUM 10 MG PO TABS
10.0000 mg | ORAL_TABLET | Freq: Every day | ORAL | Status: DC
  Filled 2014-03-03: qty 1

## 2014-03-03 MED ORDER — POTASSIUM CHLORIDE IN NACL 20-0.9 MEQ/L-% IV SOLN
INTRAVENOUS | Status: DC
  Filled 2014-03-03 (×3): qty 1000

## 2014-03-03 MED ORDER — LIDOCAINE HCL (CARDIAC) 20 MG/ML IV SOLN
INTRAVENOUS | Status: AC
  Filled 2014-03-03: qty 10

## 2014-03-03 MED ORDER — BACITRACIN 50000 UNITS IM SOLR
INTRAMUSCULAR | Status: DC | PRN
  Administered 2014-03-03: 08:00:00

## 2014-03-03 MED ORDER — MORPHINE SULFATE 2 MG/ML IJ SOLN
1.0000 mg | INTRAMUSCULAR | Status: DC | PRN
  Administered 2014-03-03 (×2): 2 mg via INTRAVENOUS
  Filled 2014-03-03 (×2): qty 1

## 2014-03-03 MED ORDER — OXYCODONE-ACETAMINOPHEN 5-325 MG PO TABS
1.0000 | ORAL_TABLET | ORAL | Status: DC | PRN
  Administered 2014-03-03 – 2014-03-04 (×5): 2 via ORAL
  Filled 2014-03-03 (×5): qty 2

## 2014-03-03 MED ORDER — ROCURONIUM BROMIDE 100 MG/10ML IV SOLN
INTRAVENOUS | Status: DC | PRN
  Administered 2014-03-03: 50 mg via INTRAVENOUS
  Administered 2014-03-03: 20 mg via INTRAVENOUS

## 2014-03-03 MED ORDER — MIDAZOLAM HCL 5 MG/5ML IJ SOLN
INTRAMUSCULAR | Status: DC | PRN
  Administered 2014-03-03: 2 mg via INTRAVENOUS

## 2014-03-03 MED ORDER — SODIUM CHLORIDE 0.9 % IJ SOLN
3.0000 mL | INTRAMUSCULAR | Status: DC | PRN
Start: 2014-03-03 — End: 2014-03-04

## 2014-03-03 MED ORDER — SODIUM CHLORIDE 0.9 % IJ SOLN
3.0000 mL | Freq: Two times a day (BID) | INTRAMUSCULAR | Status: DC
  Administered 2014-03-03 (×2): 3 mL via INTRAVENOUS

## 2014-03-03 MED ORDER — FLUTICASONE PROPIONATE HFA 44 MCG/ACT IN AERO
1.0000 | INHALATION_SPRAY | Freq: Two times a day (BID) | RESPIRATORY_TRACT | Status: DC
  Administered 2014-03-03 – 2014-03-04 (×2): 1 via RESPIRATORY_TRACT
  Filled 2014-03-03: qty 10.6

## 2014-03-03 MED ORDER — GLYCOPYRROLATE 0.2 MG/ML IJ SOLN
INTRAMUSCULAR | Status: DC | PRN
  Administered 2014-03-03: .4 mg via INTRAVENOUS

## 2014-03-03 MED ORDER — NEOSTIGMINE METHYLSULFATE 10 MG/10ML IV SOLN
INTRAVENOUS | Status: DC | PRN
  Administered 2014-03-03: 3 mg via INTRAVENOUS

## 2014-03-03 MED ORDER — DULOXETINE HCL 60 MG PO CPEP
60.0000 mg | ORAL_CAPSULE | Freq: Every day | ORAL | Status: DC
  Filled 2014-03-03: qty 1

## 2014-03-03 MED ORDER — TRAZODONE HCL 100 MG PO TABS
100.0000 mg | ORAL_TABLET | Freq: Every day | ORAL | Status: DC
  Administered 2014-03-03: 100 mg via ORAL
  Filled 2014-03-03 (×2): qty 1

## 2014-03-03 MED ORDER — THROMBIN 5000 UNITS EX SOLR
OROMUCOSAL | Status: DC | PRN
  Administered 2014-03-03: 09:00:00 via TOPICAL

## 2014-03-03 MED ORDER — PROPOFOL 10 MG/ML IV BOLUS
INTRAVENOUS | Status: AC
  Filled 2014-03-03: qty 20

## 2014-03-03 MED ORDER — LEVOTHYROXINE SODIUM 75 MCG PO TABS: 75.0000 ug | ORAL_TABLET | Freq: Every day | ORAL | Status: DC

## 2014-03-03 MED ORDER — LIDOCAINE HCL (CARDIAC) 20 MG/ML IV SOLN
INTRAVENOUS | Status: DC | PRN
  Administered 2014-03-03: 100 mg via INTRAVENOUS

## 2014-03-03 MED ORDER — ONDANSETRON HCL 4 MG/2ML IJ SOLN: 4.0000 mg | Freq: Once | INTRAMUSCULAR | Status: DC | PRN

## 2014-03-03 MED ORDER — ONDANSETRON HCL 4 MG/2ML IJ SOLN
INTRAMUSCULAR | Status: DC | PRN
  Administered 2014-03-03: 4 mg via INTRAVENOUS

## 2014-03-03 MED ORDER — ARTIFICIAL TEARS OP OINT
TOPICAL_OINTMENT | OPHTHALMIC | Status: AC
  Filled 2014-03-03: qty 3.5

## 2014-03-03 MED ORDER — ONDANSETRON HCL 4 MG/2ML IJ SOLN: 4.0000 mg | INTRAMUSCULAR | Status: DC | PRN

## 2014-03-03 MED ORDER — BISOPROLOL FUMARATE 5 MG PO TABS
2.5000 mg | ORAL_TABLET | Freq: Every day | ORAL | Status: DC
  Administered 2014-03-03: 2.5 mg via ORAL
  Filled 2014-03-03 (×2): qty 0.5

## 2014-03-03 MED ORDER — MENTHOL 3 MG MT LOZG
1.0000 | LOZENGE | OROMUCOSAL | Status: DC | PRN
  Filled 2014-03-03: qty 9

## 2014-03-03 MED ORDER — GLYCOPYRROLATE 0.2 MG/ML IJ SOLN
INTRAMUSCULAR | Status: AC
  Filled 2014-03-03: qty 2

## 2014-03-03 MED ORDER — ACETAMINOPHEN 325 MG PO TABS: 650.0000 mg | ORAL_TABLET | ORAL | Status: DC | PRN

## 2014-03-03 MED ORDER — METHOCARBAMOL 500 MG PO TABS
500.0000 mg | ORAL_TABLET | Freq: Four times a day (QID) | ORAL | Status: DC | PRN
  Administered 2014-03-03 – 2014-03-04 (×4): 500 mg via ORAL
  Filled 2014-03-03 (×4): qty 1

## 2014-03-03 SURGICAL SUPPLY — 54 items
APL SKNCLS STERI-STRIP NONHPOA (GAUZE/BANDAGES/DRESSINGS) ×1
BAG DECANTER FOR FLEXI CONT (MISCELLANEOUS) ×2 IMPLANT
BENZOIN TINCTURE PRP APPL 2/3 (GAUZE/BANDAGES/DRESSINGS) ×2 IMPLANT
BONE MATRIX OSTEOCEL PRO SM (Bone Implant) ×1 IMPLANT
BUR MATCHSTICK NEURO 3.0 LAGG (BURR) ×2 IMPLANT
CAGE COROENT SLP 6X11X14 (Cage) ×2 IMPLANT
CANISTER SUCT 3000ML (MISCELLANEOUS) ×2 IMPLANT
CONT SPEC 4OZ CLIKSEAL STRL BL (MISCELLANEOUS) ×2 IMPLANT
DRAPE C-ARM 42X72 X-RAY (DRAPES) ×4 IMPLANT
DRAPE LAPAROTOMY 100X72 PEDS (DRAPES) ×2 IMPLANT
DRAPE MICROSCOPE LEICA (MISCELLANEOUS) ×2 IMPLANT
DRAPE POUCH INSTRU U-SHP 10X18 (DRAPES) ×2 IMPLANT
DRILL BIT HELIX (BIT) ×1 IMPLANT
DRSG OPSITE 4X5.5 SM (GAUZE/BANDAGES/DRESSINGS) ×2 IMPLANT
DRSG OPSITE POSTOP 4X6 (GAUZE/BANDAGES/DRESSINGS) ×1 IMPLANT
DRSG TELFA 3X8 NADH (GAUZE/BANDAGES/DRESSINGS) ×2 IMPLANT
DURAPREP 6ML APPLICATOR 50/CS (WOUND CARE) ×2 IMPLANT
ELECT COATED BLADE 2.86 ST (ELECTRODE) ×2 IMPLANT
ELECT REM PT RETURN 9FT ADLT (ELECTROSURGICAL) ×2
ELECTRODE REM PT RTRN 9FT ADLT (ELECTROSURGICAL) ×1 IMPLANT
GAUZE SPONGE 4X4 16PLY XRAY LF (GAUZE/BANDAGES/DRESSINGS) IMPLANT
GLOVE BIO SURGEON STRL SZ8 (GLOVE) ×3 IMPLANT
GLOVE BIOGEL PI IND STRL 7.0 (GLOVE) IMPLANT
GLOVE BIOGEL PI INDICATOR 7.0 (GLOVE) ×2
GLOVE ECLIPSE 6.5 STRL STRAW (GLOVE) ×1 IMPLANT
GLOVE SURG SS PI 7.0 STRL IVOR (GLOVE) ×3 IMPLANT
GOWN STRL REUS W/ TWL LRG LVL3 (GOWN DISPOSABLE) IMPLANT
GOWN STRL REUS W/ TWL XL LVL3 (GOWN DISPOSABLE) ×1 IMPLANT
GOWN STRL REUS W/TWL 2XL LVL3 (GOWN DISPOSABLE) IMPLANT
GOWN STRL REUS W/TWL LRG LVL3 (GOWN DISPOSABLE) ×4
GOWN STRL REUS W/TWL XL LVL3 (GOWN DISPOSABLE) ×4
HEMOSTAT POWDER KIT SURGIFOAM (HEMOSTASIS) ×2 IMPLANT
KIT BASIN OR (CUSTOM PROCEDURE TRAY) ×2 IMPLANT
KIT ROOM TURNOVER OR (KITS) ×2 IMPLANT
NDL HYPO 25X1 1.5 SAFETY (NEEDLE) ×1 IMPLANT
NDL SPNL 20GX3.5 QUINCKE YW (NEEDLE) ×1 IMPLANT
NEEDLE HYPO 25X1 1.5 SAFETY (NEEDLE) ×2 IMPLANT
NEEDLE SPNL 20GX3.5 QUINCKE YW (NEEDLE) ×2 IMPLANT
NS IRRIG 1000ML POUR BTL (IV SOLUTION) ×2 IMPLANT
PACK LAMINECTOMY NEURO (CUSTOM PROCEDURE TRAY) ×2 IMPLANT
PAD ARMBOARD 7.5X6 YLW CONV (MISCELLANEOUS) ×8 IMPLANT
PAD DRESSING TELFA 3X8 NADH (GAUZE/BANDAGES/DRESSINGS) ×1 IMPLANT
PLATE ARCHON 2-LEVEL 38MM (Plate) ×1 IMPLANT
RUBBERBAND STERILE (MISCELLANEOUS) ×4 IMPLANT
SCREW ARCHON SELFTAP 4.0X13 (Screw) ×6 IMPLANT
SPONGE INTESTINAL PEANUT (DISPOSABLE) ×2 IMPLANT
SPONGE SURGIFOAM ABS GEL SZ50 (HEMOSTASIS) ×2 IMPLANT
STRIP CLOSURE SKIN 1/2X4 (GAUZE/BANDAGES/DRESSINGS) ×2 IMPLANT
SUT VIC AB 3-0 SH 8-18 (SUTURE) ×4 IMPLANT
SYR 20ML ECCENTRIC (SYRINGE) ×2 IMPLANT
TOWEL OR 17X24 6PK STRL BLUE (TOWEL DISPOSABLE) ×2 IMPLANT
TOWEL OR 17X26 10 PK STRL BLUE (TOWEL DISPOSABLE) ×2 IMPLANT
TRAP SPECIMEN MUCOUS 40CC (MISCELLANEOUS) IMPLANT
WATER STERILE IRR 1000ML POUR (IV SOLUTION) ×2 IMPLANT

## 2014-03-03 NOTE — Plan of Care (Signed)
Problem: Consults Goal: Diagnosis - Spinal Surgery Outcome: Completed/Met Date Met:  03/03/14 Cervical Spine Fusion     

## 2014-03-03 NOTE — Anesthesia Postprocedure Evaluation (Signed)
  Anesthesia Post-op Note  Patient: [name]  Procedure(s) Performed: Procedure(s): ANTERIOR CERVICAL DECOMPRESSION/DISCECTOMY FUSION 2 LEVELS four/five, five/six (N/A)  Patient Location: PACU  Anesthesia Type:General  Level of Consciousness: awake, oriented, sedated and patient cooperative  Airway and Oxygen Therapy: Patient Spontanous Breathing  Post-op Pain: mild  Post-op Assessment: Post-op Vital signs reviewed, Patient's Cardiovascular Status Stable, Respiratory Function Stable, Patent Airway and No signs of Nausea or vomiting  Post-op Vital Signs: stable  Last Vitals:  Filed Vitals:   03/03/14 1045  BP: 123/77  Pulse: 70  Temp:   Resp: 19    Complications: No apparent anesthesia complications

## 2014-03-03 NOTE — H&P (Signed)
Subjective:   Patient is a 53 y.o. female admitted for acdf. The patient first presented to me with complaints of neck and arm pain.. Onset of symptoms was months ago. The pain is described as aching and occurs constantly. The pain is rated severe, and is located in the neck and radiates to the arms. The symptoms have been progressive. Symptoms are exacerbated by activity, and are relieved by nothing  Previous work up includes MRI.  Past Medical History  Diagnosis Date  . Asthma   . Hypertension   . Anxiety   . Depression   . Seasonal allergies   . Thyroid disease     hypothyroidism  . Hx of abnormal Pap smear 2006    no treatment to cervix  . Heart murmur   . Panic attack 03/04/13  . Headache(784.0)   . Hypothyroidism   . Complication of anesthesia 2000    aspirated- had been sick prior to surgery- asthma  . Ruptured cervical disc   . Arthritis     hips/back  . Pneumonia   . Shortness of breath     Past Surgical History  Procedure Laterality Date  . Abdominal hysterectomy  02/2010    [facility]--Dr. Joan Flores  . Endometrial biopsy  10-10-09    Benign fragments of endometrial polyp  . Inguinal hernia repair      as an infant  . Liposuction  2006    thighs, chin  . Anterior and posterior repair N/A 04/05/2013    Procedure: ANTERIOR (CYSTOCELE) AND POSTERIOR REPAIR (RECTOCELE);  Surgeon: Arloa Koh, MD;  Location: [facility];  Service: Gynecology;  Laterality: N/A;  . Bladder suspension N/A 04/05/2013    Procedure: TRANSVAGINAL TAPE (TVT) PROCEDURE WITH CYSTO;  Surgeon: Arloa Koh, MD;  Location: [facility];  Service: Gynecology;  Laterality: N/A;  site- vagina and urethra    Allergies  Allergen Reactions  . Bactrim [Sulfamethoxazole-Tmp Ds] Anaphylaxis  . Other Anaphylaxis    All nuts   . Ciprofloxacin     SOB & blisters on throat    History  Substance Use Topics  . Smoking status: Never Smoker   . Smokeless tobacco: Never Used  . Alcohol Use: 1.5 oz/week    3  drink(Duncan) per week     Comment: 3 glasses of wine per week    Family History  Problem Relation Age of Onset  . Brain cancer Mother     dec with brain & lung CA  . Lung cancer Mother   . Diabetes Maternal Grandmother   . Diabetes Maternal Grandfather   . Diabetes Paternal Grandmother   . Diabetes Paternal Grandfather   . Bladder Cancer Father     dec age 58  . Prostate cancer Father    Prior to Admission medications   Medication Sig Start Date End Date Taking? Authorizing Provider  ADVIL 200 MG tablet Take 200 mg by mouth 2 (two) times daily. 05/17/13  Yes Historical Provider, MD  albuterol (PROVENTIL HFA;VENTOLIN HFA) 108 (90 BASE) MCG/ACT inhaler Inhale 2 puffs into the lungs daily as needed for wheezing or shortness of breath (rescue inhaler).   Yes Historical Provider, MD  bisoprolol (ZEBETA) 5 MG tablet Take 2.5 mg by mouth daily.    Yes Historical Provider, MD  CELEBREX 200 MG capsule Take 200 mg by mouth 2 (two) times daily.  11/28/12  Yes Historical Provider, MD  cetirizine (ZYRTEC) 10 MG tablet Take 10 mg by mouth daily.     Yes Historical  Provider, MD  clonazePAM (KLONOPIN) 1 MG tablet Take 1 mg by mouth 3 (three) times daily as needed for anxiety.   Yes Historical Provider, MD  DULoxetine (CYMBALTA) 60 MG capsule Take 60 mg by mouth daily.     Yes Historical Provider, MD  HYDROcodone-acetaminophen (NORCO/VICODIN) 5-325 MG per tablet Take 2 tablets by mouth every 6 (six) hours as needed (pain).  01/26/14  Yes Historical Provider, MD  levothyroxine (SYNTHROID, LEVOTHROID) 88 MCG tablet Take 88 mcg by mouth daily before breakfast.   Yes Historical Provider, MD  minocycline (MINOCIN,DYNACIN) 50 MG capsule Take 50 mg by mouth daily.   Yes Historical Provider, MD  mometasone (NASONEX) 50 MCG/ACT nasal spray Place 2 sprays into the nose 2 (two) times daily.    Yes Historical Provider, MD  montelukast (SINGULAIR) 10 MG tablet Take 10 mg by mouth at bedtime.   Yes Historical Provider, MD   Multiple Vitamin (MULTIVITAMIN) capsule Take 1 capsule by mouth daily.   Yes Historical Provider, MD  Nutritional Supplements (ESTROVEN PO) Take 1 tablet by mouth daily.   Yes Historical Provider, MD  polyethylene glycol (MIRALAX / GLYCOLAX) packet Take 17 g by mouth daily.   Yes Historical Provider, MD  Probiotic Product (PROBIOTIC FORMULA) CAPS Take 1 capsule by mouth daily.     Yes Historical Provider, MD  promethazine (PHENERGAN) 25 MG tablet Take 1 tablet by mouth every 6 (six) hours as needed for nausea.  03/17/13  Yes Historical Provider, MD  QVAR 80 MCG/ACT inhaler Inhale 2 puffs into the lungs 2 (two) times daily.  01/04/13  Yes Historical Provider, MD  traMADol (ULTRAM) 50 MG tablet Take 100 mg by mouth at bedtime.  04/02/13  Yes Historical Provider, MD  traZODone (DESYREL) 50 MG tablet Take 100 mg by mouth at bedtime.    Yes Historical Provider, MD  EPIPEN 2-PAK 0.3 MG/0.3ML SOAJ injection Inject 0.3 mg into the muscle as needed. 02/01/13   Historical Provider, MD  levothyroxine (SYNTHROID, LEVOTHROID) 75 MCG tablet Take 75 mcg by mouth daily.      Historical Provider, MD     Review of Systems  Positive ROS: neg  All other systems have been reviewed and were otherwise negative with the exception of those mentioned in the HPI and as above.  Objective: Vital signs in last 24 hours: Temp:  [98.4 F (36.9 C)] 98.4 F (36.9 C) (10/23 0603) Pulse Rate:  [71] 71 (10/23 0552) Resp:  [18] 18 (10/23 0552) BP: (150)/(83) 150/83 mmHg (10/23 0552) SpO2:  [99 %] 99 % (10/23 0552) Weight:  [89.812 kg (198 lb)] 89.812 kg (198 lb) (10/23 0552)  General Appearance: Alert, cooperative, no distress, appears stated age Head: Normocephalic, without obvious abnormality, atraumatic Eyes: PERRL, conjunctiva/corneas clear, EOM'Duncan intact      Neck: Supple, symmetrical, trachea midline, Back: Symmetric, no curvature, ROM normal, no CVA tenderness Lungs:  respirations unlabored Heart: Regular rate and  rhythm Abdomen: Soft, non-tender Extremities: Extremities normal, atraumatic, no cyanosis or edema Pulses: 2+ and symmetric all extremities Skin: Skin color, texture, turgor normal, no rashes or lesions  NEUROLOGIC:  Mental status: Alert and oriented x4, no aphasia, good attention span, fund of knowledge and memory  Motor Exam - grossly normal Sensory Exam - grossly normal Reflexes: 1+ Coordination - grossly normal Gait - grossly normal Balance - grossly normal Cranial Nerves: I: smell Not tested  II: visual acuity  OS: nl    OD: nl  II: visual fields Full to confrontation  II: pupils Equal, round, reactive to light  III,VII: ptosis None  III,IV,VI: extraocular muscles  Full ROM  V: mastication Normal  V: facial light touch sensation  Normal  V,VII: corneal reflex  Present  VII: facial muscle function - upper  Normal  VII: facial muscle function - lower Normal  VIII: hearing Not tested  IX: soft palate elevation  Normal  IX,X: gag reflex Present  XI: trapezius strength  5/5  XI: sternocleidomastoid strength 5/5  XI: neck flexion strength  5/5  XII: tongue strength  Normal    Data Review Lab Results  Component Value Date   WBC 5.5 03/01/2014   HGB 15.2* 03/01/2014   HCT 46.1* 03/01/2014   MCV 90.0 03/01/2014   PLT 238 03/01/2014   Lab Results  Component Value Date   NA 139 03/01/2014   K 4.4 03/01/2014   CL 103 03/01/2014   CO2 26 03/01/2014   BUN 16 03/01/2014   CREATININE 0.66 03/01/2014   GLUCOSE 102* 03/01/2014   Lab Results  Component Value Date   INR 0.99 03/01/2014    Assessment:   Cervical neck pain with herniated nucleus pulposus/ spondylosis/ stenosis at c45 c56 Patient has failed conservative therapy. Planned surgery : acdf c45 c56  Plan:   I explained the condition and procedure to the patient and answered any questions.  Patient wishes to proceed with procedure as planned. Understands risks/ benefits/ and expected or typical  outcomes.  [name] 03/03/2014 7:00 AM

## 2014-03-03 NOTE — Op Note (Signed)
03/03/2014  10:07 AM  PATIENT:  [name]  53 y.o. female  PRE-OPERATIVE DIAGNOSIS:  Cervical spondylosis with neck and arm pain  POST-OPERATIVE DIAGNOSIS:  Same  PROCEDURE:  1. Decompressive anterior cervical discectomy C4-5 and C5-6, 2. Anterior cervical arthrodesis C4-5 and C5-6 utilizing peek interbody cages packed with local autograft and morcellized allograft, 3. Anterior cervical plating utilizing a Nuvasive archon plate  SURGEON:  Sherley Bounds, MD  ASSISTANTS: Dr. Christella Noa  ANESTHESIA:   General  EBL: 50 ml  Total I/O In: 1000 [I.V.:1000] Out: -   BLOOD ADMINISTERED:none  DRAINS: None   SPECIMEN:  No Specimen  INDICATION FOR PROCEDURE: This patient presented with a long history of neck pain with radiation into the right arm. She had an MRI which showed significant degenerative disease at C4-5 anterior osteophytes and posterior osteophytes as well as a right pre-foraminal disc protrusion at C5-6. She tried medical management quite a long time without relief. Recommended ACDF with plating. Patient understood the risks, benefits, and alternatives and potential outcomes and wished to proceed.  PROCEDURE DETAILS: Patient was brought to the operating room placed under general endotracheal anesthesia. Patient was placed in the supine position on the operating room table. The neck was prepped with Duraprep and draped in a sterile fashion.   Three cc of local anesthesia was injected and a transverse incision was made on the right side of the neck.  Dissection was carried down thru the subcutaneous tissue and the platysma was  elevated, opened, and undermined with Metzenbaum scissors.  Dissection was then carried out thru an avascular plane leaving the sternocleidomastoid carotid artery and jugular vein laterally and the trachea and esophagus medially. The ventral aspect of the vertebral column was identified and a localizing x-ray was taken. The C4-5 level was identified.  The longus colli muscles were then elevated and the retractor was placed to expose C4-5 and C5-6. The annulus was incised at C4-5 and C5-6 and the disc space entered. Discectomy was performed with micro-curettes and pituitary rongeurs. I then used the high-speed drill to drill the endplates down to the level of the posterior longitudinal ligament. The drill shavings were saved in a mucous trap for later arthrodesis. The operating microscope was draped and brought into the field provided additional magnification, illumination and visualization. Discectomy was continued posteriorly thru the disc space. Posterior longitudinal ligament was opened with a nerve hook, and then removed along with disc herniation and osteophytes, decompressing the spinal canal and thecal sac. We then continued to remove osteophytic overgrowth and disc material decompressing the neural foramina and exiting nerve roots bilaterally at both C4-5 and C5-6. The scope was angled up and down to help decompress and undercut the vertebral bodies. Once the decompression was completed at both levels we could pass a nerve hook circumferentially to assure adequate decompression in the midline and in the neural foramina. So by both visualization and palpation we felt we had an adequate decompression of the neural elements. We then measured the height of the intravertebral disc space and selected a 6 mm millimeter Peek interbody cage packed with autograft and morcellized allograft. It was then gently positioned in the intravertebral disc space and countersunk. I then used a 38 mm plate and placed variable angle screws into the vertebral bodies and locked them into position. The wound was irrigated with bacitracin solution, checked for hemostasis which was established and confirmed. Once meticulous hemostasis was achieved, we then proceeded with closure. The platysma was closed with interrupted  3-0 undyed Vicryl suture, the subcuticular layer was closed with  interrupted 3-0 undyed Vicryl suture. The skin edges were approximated with steristrips. The drapes were removed. A sterile dressing was applied. The patient was then awakened from general anesthesia and transferred to the recovery room in stable condition. At the end of the procedure all sponge, needle and instrument counts were correct.   PLAN OF CARE: Admit for overnight observation  PATIENT DISPOSITION:  To PACU in stable condition   Delay start of Pharmacological VTE agent (>24hrs) due to surgical blood loss or risk of bleeding:  yes

## 2014-03-03 NOTE — Progress Notes (Signed)
Orthopedic Tech Progress Note Patient Details:  [name] 03/22/1961 [id number]  Ortho Devices Type of Ortho Device: Soft collar Ortho Device/Splint Location: neck Ortho Device/Splint Interventions: Application   [name] 03/03/2014, 12:58 PM

## 2014-03-03 NOTE — Anesthesia Preprocedure Evaluation (Signed)
Anesthesia Evaluation  Patient identified by MRN, date of birth, ID band Patient awake    Reviewed: Allergy & Precautions, H&P , NPO status , Patient's Chart, lab work & pertinent test results  Airway       Dental   Pulmonary asthma ,          Cardiovascular hypertension,     Neuro/Psych  Headaches, Depression    GI/Hepatic   Endo/Other  Hypothyroidism   Renal/GU      Musculoskeletal  (+) Arthritis -,   Abdominal   Peds  Hematology   Anesthesia Other Findings   Reproductive/Obstetrics                           Anesthesia Physical Anesthesia Plan  ASA: II  Anesthesia Plan: General   Post-op Pain Management:    Induction: Intravenous  Airway Management Planned: Oral ETT  Additional Equipment:   Intra-op Plan:   Post-operative Plan: Extubation in OR  Informed Consent: I have reviewed the patients History and Physical, chart, labs and discussed the procedure including the risks, benefits and alternatives for the proposed anesthesia with the patient or authorized representative who has indicated his/her understanding and acceptance.     Plan Discussed with: CRNA, Anesthesiologist and Surgeon  Anesthesia Plan Comments:         Anesthesia Quick Evaluation

## 2014-03-03 NOTE — Transfer of Care (Signed)
Immediate Anesthesia Transfer of Care Note  Patient: [name]  Procedure(s) Performed: Procedure(s): ANTERIOR CERVICAL DECOMPRESSION/DISCECTOMY FUSION 2 LEVELS four/five, five/six (N/A)  Patient Location: PACU  Anesthesia Type:General  Level of Consciousness: awake, alert  and oriented  Airway & Oxygen Therapy: Patient Spontanous Breathing and Patient connected to nasal cannula oxygen  Post-op Assessment: Report given to PACU RN and Post -op Vital signs reviewed and stable  Post vital signs: Reviewed and stable  Complications: No apparent anesthesia complications

## 2014-03-04 DIAGNOSIS — M4722 Other spondylosis with radiculopathy, cervical region: Secondary | ICD-10-CM | POA: Diagnosis not present

## 2014-03-04 MED ORDER — OXYCODONE-ACETAMINOPHEN 5-325 MG PO TABS: 1.0000 | ORAL_TABLET | ORAL | Status: DC | PRN

## 2014-03-04 NOTE — Discharge Summary (Signed)
Physician Discharge Summary  Patient ID: [name] MRN: [id number] DOB/AGE: 1961/04/12 53 y.o.  Admit date: 03/03/2014 Discharge date: 03/04/2014  Admission Diagnoses: Cervical spondylosis C4-5 C5-6 with radiculopathy  Discharge Diagnoses: Cervical spondylosis C4-5 C5-6 with radiculopathy Active Problems:   S/P cervical spinal fusion   Discharged Condition: good  Hospital Course: Patient was admitted to undergo anterior cervical decompression at C4-5 and C5-6. She tolerated surgery well.  Consults: None  Significant Diagnostic Studies: None  Treatments: surgery: 2 cervical decompression C4-5 C5-6 arthrodesis with structural allograft and plate fixation  Discharge Exam: Blood pressure 148/79, pulse 58, temperature 97.4 F (36.3 C), temperature source Oral, resp. rate 18, height 5\' 11"  (1.803 m), weight 89.812 kg (198 lb), last menstrual period 02/26/2010, SpO2 100.00%. Incision is clean and dry motor function is intact slight weakness in grip on the right compared to left.  Disposition: 01-Home or Self Care  Discharge Instructions   Call MD for:  redness, tenderness, or signs of infection (pain, swelling, redness, odor or green/yellow discharge around incision site)    Complete by:  As directed      Call MD for:  severe uncontrolled pain    Complete by:  As directed      Call MD for:  temperature >100.4    Complete by:  As directed      Diet - low sodium heart healthy    Complete by:  As directed      Increase activity slowly    Complete by:  As directed             Medication List         ADVIL 200 MG tablet  Generic drug:  ibuprofen  Take 200 mg by mouth 2 (two) times daily.     albuterol 108 (90 BASE) MCG/ACT inhaler  Commonly known as:  PROVENTIL HFA;VENTOLIN HFA  Inhale 2 puffs into the lungs daily as needed for wheezing or shortness of breath (rescue inhaler).     bisoprolol 5 MG tablet  Commonly known as:  ZEBETA  Take 2.5 mg by mouth  daily.     CELEBREX 200 MG capsule  Generic drug:  celecoxib  Take 200 mg by mouth 2 (two) times daily.     cetirizine 10 MG tablet  Commonly known as:  ZYRTEC  Take 10 mg by mouth daily.     clonazePAM 1 MG tablet  Commonly known as:  KLONOPIN  Take 1 mg by mouth 3 (three) times daily as needed for anxiety.     DULoxetine 60 MG capsule  Commonly known as:  CYMBALTA  Take 60 mg by mouth daily.     EPIPEN 2-PAK 0.3 mg/0.3 mL Soaj injection  Generic drug:  EPINEPHrine  Inject 0.3 mg into the muscle as needed.     ESTROVEN PO  Take 1 tablet by mouth daily.     HYDROcodone-acetaminophen 5-325 MG per tablet  Commonly known as:  NORCO/VICODIN  Take 2 tablets by mouth every 6 (six) hours as needed (pain).     levothyroxine 88 MCG tablet  Commonly known as:  SYNTHROID, LEVOTHROID  Take 88 mcg by mouth daily before breakfast.     levothyroxine 75 MCG tablet  Commonly known as:  SYNTHROID, LEVOTHROID  Take 75 mcg by mouth daily.     minocycline 50 MG capsule  Commonly known as:  MINOCIN,DYNACIN  Take 50 mg by mouth daily.     mometasone 50 MCG/ACT nasal spray  Commonly known as:  NASONEX  Place 2 sprays into the nose 2 (two) times daily.     multivitamin capsule  Take 1 capsule by mouth daily.     oxyCODONE-acetaminophen 5-325 MG per tablet  Commonly known as:  PERCOCET/ROXICET  Take 1-2 tablets by mouth every 4 (four) hours as needed for moderate pain.     polyethylene glycol packet  Commonly known as:  MIRALAX / GLYCOLAX  Take 17 g by mouth daily.     PROBIOTIC FORMULA Caps  Take 1 capsule by mouth daily.     promethazine 25 MG tablet  Commonly known as:  PHENERGAN  Take 1 tablet by mouth every 6 (six) hours as needed for nausea.     QVAR 80 MCG/ACT inhaler  Generic drug:  beclomethasone  Inhale 2 puffs into the lungs 2 (two) times daily.     SINGULAIR 10 MG tablet  Generic drug:  montelukast  Take 10 mg by mouth at bedtime.     traMADol 50 MG tablet   Commonly known as:  ULTRAM  Take 100 mg by mouth at bedtime.     traZODone 50 MG tablet  Commonly known as:  DESYREL  Take 100 mg by mouth at bedtime.         SignedEarleen Newport 03/04/2014, 9:48 AM

## 2014-03-04 NOTE — Progress Notes (Signed)
Patient alert and oriented, mae's well, voiding adequate amount of urine, swallowing without difficulty, no c/o pain. Patient discharged home with family. Script and discharged instructions given to patient. Patient and family stated understanding of d/c instructions given and has an appointment with MD. Aisha Jerrick Farve RN 

## 2014-03-04 NOTE — Discharge Instructions (Signed)
Wound Care Keep incision covered and dry for 3 days.  You may remove outer bandage after 3 days Do not put any creams, lotions, or ointments on incision. Leave steri-strips on neck.  They will fall off by themselves. Activity Walk each and every day, increasing distance each day. No lifting greater than 5 lbs.  Avoid excessive neck motion. No driving for 2 weeks; may ride as a passenger locally. Wear neck brace at all times except when showering or otherwise instructed. Diet Resume your normal diet.  Return to Work Will be discussed at you follow up appointment. Call Your Doctor If Any of These Occur Redness, drainage, or swelling at the wound.  Temperature greater than 101 degrees. Severe pain not relieved by pain medication. Increased difficulty swallowing.  Incision starts to come apart. Follow Up Appt Call today for appointment in 1-2 weeks ([phone]) or for problems.  If you have any hardware placed in your spine, you will need an x-ray before your appointment.

## 2014-03-08 ENCOUNTER — Telehealth: Payer: Self-pay | Admitting: Obstetrics and Gynecology

## 2014-03-08 ENCOUNTER — Encounter (HOSPITAL_COMMUNITY): Payer: Self-pay | Admitting: Neurological Surgery

## 2014-03-08 NOTE — Telephone Encounter (Signed)
Pt called during lunch lmtcb. Call back to pt she is asking that we send her order for bone density to [facility] Fax # [phone] instead of here in [place]. She has appt scheduled already

## 2014-03-09 NOTE — Telephone Encounter (Signed)
Order to [place] for review and signature before fax.

## 2014-03-09 NOTE — Telephone Encounter (Signed)
Order for BMD faxed to [facility] with cover sheet. Spoke with patient. Advised order has been sent. Patient is agreeable and is scheduled for mammogram and BMD.  Routing to provider for final review. Patient agreeable to disposition. Will close encounter

## 2014-03-13 ENCOUNTER — Encounter (HOSPITAL_COMMUNITY): Payer: Self-pay | Admitting: Neurological Surgery

## 2015-02-07 ENCOUNTER — Emergency Department (HOSPITAL_COMMUNITY)
Admission: EM | Admit: 2015-02-07 | Discharge: 2015-02-07 | Disposition: A | Discharge status: Home / Self Care | Payer: 59 | Attending: Emergency Medicine | Admitting: Emergency Medicine

## 2015-02-07 ENCOUNTER — Emergency Department (HOSPITAL_COMMUNITY): Payer: 59

## 2015-02-07 ENCOUNTER — Encounter (HOSPITAL_COMMUNITY): Payer: Self-pay | Admitting: Emergency Medicine

## 2015-02-07 DIAGNOSIS — M158 Other polyosteoarthritis: Secondary | ICD-10-CM | POA: Diagnosis not present

## 2015-02-07 DIAGNOSIS — R55 Syncope and collapse: Secondary | ICD-10-CM | POA: Diagnosis not present

## 2015-02-07 DIAGNOSIS — I10 Essential (primary) hypertension: Secondary | ICD-10-CM | POA: Diagnosis not present

## 2015-02-07 DIAGNOSIS — Z79899 Other long term (current) drug therapy: Secondary | ICD-10-CM | POA: Diagnosis not present

## 2015-02-07 DIAGNOSIS — S0990XA Unspecified injury of head, initial encounter: Secondary | ICD-10-CM | POA: Diagnosis present

## 2015-02-07 DIAGNOSIS — Y998 Other external cause status: Secondary | ICD-10-CM | POA: Insufficient documentation

## 2015-02-07 DIAGNOSIS — Z7951 Long term (current) use of inhaled steroids: Secondary | ICD-10-CM | POA: Insufficient documentation

## 2015-02-07 DIAGNOSIS — E039 Hypothyroidism, unspecified: Secondary | ICD-10-CM | POA: Diagnosis not present

## 2015-02-07 DIAGNOSIS — Z8701 Personal history of pneumonia (recurrent): Secondary | ICD-10-CM | POA: Diagnosis not present

## 2015-02-07 DIAGNOSIS — R011 Cardiac murmur, unspecified: Secondary | ICD-10-CM | POA: Diagnosis not present

## 2015-02-07 DIAGNOSIS — S76012A Strain of muscle, fascia and tendon of left hip, initial encounter: Secondary | ICD-10-CM | POA: Diagnosis not present

## 2015-02-07 DIAGNOSIS — F329 Major depressive disorder, single episode, unspecified: Secondary | ICD-10-CM | POA: Insufficient documentation

## 2015-02-07 DIAGNOSIS — Y9289 Other specified places as the place of occurrence of the external cause: Secondary | ICD-10-CM | POA: Insufficient documentation

## 2015-02-07 DIAGNOSIS — S060X9A Concussion with loss of consciousness of unspecified duration, initial encounter: Secondary | ICD-10-CM | POA: Diagnosis not present

## 2015-02-07 DIAGNOSIS — F410 Panic disorder [episodic paroxysmal anxiety] without agoraphobia: Secondary | ICD-10-CM | POA: Insufficient documentation

## 2015-02-07 DIAGNOSIS — J45909 Unspecified asthma, uncomplicated: Secondary | ICD-10-CM | POA: Insufficient documentation

## 2015-02-07 DIAGNOSIS — Y9389 Activity, other specified: Secondary | ICD-10-CM | POA: Diagnosis not present

## 2015-02-07 DIAGNOSIS — W1839XA Other fall on same level, initial encounter: Secondary | ICD-10-CM | POA: Diagnosis not present

## 2015-02-07 LAB — BASIC METABOLIC PANEL
ANION GAP: 9 (ref 5–15)
BUN: 16 mg/dL (ref 6–20)
CHLORIDE: 106 mmol/L (ref 101–111)
CO2: 25 mmol/L (ref 22–32)
Calcium: 9.8 mg/dL (ref 8.9–10.3)
Creatinine, Ser: 0.67 mg/dL (ref 0.44–1.00)
Glucose, Bld: 98 mg/dL (ref 65–99)
POTASSIUM: 3.9 mmol/L (ref 3.5–5.1)
SODIUM: 140 mmol/L (ref 135–145)

## 2015-02-07 LAB — I-STAT TROPONIN, ED: Troponin i, poc: 0 ng/mL (ref 0.00–0.08)

## 2015-02-07 LAB — CBC WITH DIFFERENTIAL/PLATELET
BASOS ABS: 0 10*3/uL (ref 0.0–0.1)
Basophils Relative: 0 %
EOS PCT: 1 %
Eosinophils Absolute: 0.1 10*3/uL (ref 0.0–0.7)
HCT: 44.2 % (ref 36.0–46.0)
HEMOGLOBIN: 14.7 g/dL (ref 12.0–15.0)
LYMPHS PCT: 31 %
Lymphs Abs: 2.4 10*3/uL (ref 0.7–4.0)
MCH: 29.2 pg (ref 26.0–34.0)
MCHC: 33.3 g/dL (ref 30.0–36.0)
MCV: 87.9 fL (ref 78.0–100.0)
Monocytes Absolute: 0.3 10*3/uL (ref 0.1–1.0)
Monocytes Relative: 4 %
NEUTROS PCT: 64 %
Neutro Abs: 5 10*3/uL (ref 1.7–7.7)
PLATELETS: 202 10*3/uL (ref 150–400)
RBC: 5.03 MIL/uL (ref 3.87–5.11)
RDW: 13 % (ref 11.5–15.5)
WBC: 7.8 10*3/uL (ref 4.0–10.5)

## 2015-02-07 MED ORDER — SODIUM CHLORIDE 0.9 % IV BOLUS (SEPSIS)
1000.0000 mL | Freq: Once | INTRAVENOUS | Status: AC
  Administered 2015-02-07: 1000 mL via INTRAVENOUS

## 2015-02-07 MED ORDER — METHOCARBAMOL 500 MG PO TABS: 1000.0000 mg | ORAL_TABLET | Freq: Four times a day (QID) | ORAL | Status: DC | PRN

## 2015-02-07 MED ORDER — HYDROCODONE-ACETAMINOPHEN 5-325 MG PO TABS
2.0000 | ORAL_TABLET | Freq: Once | ORAL | Status: AC
  Administered 2015-02-07: 2 via ORAL
  Filled 2015-02-07: qty 2

## 2015-02-07 NOTE — ED Notes (Signed)
Per pt, states her left leg gave out and she fell-doesn't know if she lost consciousness-states right foot, left leg and head hurt

## 2015-02-07 NOTE — Discharge Instructions (Signed)
Please follow with your primary care doctor in the next 2 days for a check-up. They must obtain records for further management.   Do not hesitate to return to the Emergency Department for any new, worsening or concerning symptoms.   Do not participate in any sports or any activities that could result in head trauma until you are cleared by your pediatrician,  primary care physician or neurologist.   Please follow with your primary care doctor in the next 2 days for a check-up. They must obtain records for further management.   Do not hesitate to return to the Emergency Department for any new, worsening or concerning symptoms.     Concussion A concussion, or closed-head injury, is a brain injury caused by a direct blow to the head or by a quick and sudden movement (jolt) of the head or neck. Concussions are usually not life-threatening. Even so, the effects of a concussion can be serious. If you have had a concussion before, you are more likely to experience concussion-like symptoms after a direct blow to the head.  CAUSES  Direct blow to the head, such as from running into another player during a soccer game, being hit in a fight, or hitting your head on a hard surface.  A jolt of the head or neck that causes the brain to move back and forth inside the skull, such as in a car crash. SIGNS AND SYMPTOMS The signs of a concussion can be hard to notice. Early on, they may be missed by you, family members, and health care providers. You may look fine but act or feel differently. Symptoms are usually temporary, but they may last for days, weeks, or even longer. Some symptoms may appear right away while others may not show up for hours or days. Every head injury is different. Symptoms include:  Mild to moderate headaches that will not go away.  A feeling of pressure inside your head.  Having more trouble than usual:  Learning or remembering things you have heard.  Answering  questions.  Paying attention or concentrating.  Organizing daily tasks.  Making decisions and solving problems.  Slowness in thinking, acting or reacting, speaking, or reading.  Getting lost or being easily confused.  Feeling tired all the time or lacking energy (fatigued).  Feeling drowsy.  Sleep disturbances.  Sleeping more than usual.  Sleeping less than usual.  Trouble falling asleep.  Trouble sleeping (insomnia).  Loss of balance or feeling lightheaded or dizzy.  Nausea or vomiting.  Numbness or tingling.  Increased sensitivity to:  Sounds.  Lights.  Distractions.  Vision problems or eyes that tire easily.  Diminished sense of taste or smell.  Ringing in the ears.  Mood changes such as feeling sad or anxious.  Becoming easily irritated or angry for little or no reason.  Lack of motivation.  Seeing or hearing things other people do not see or hear (hallucinations). DIAGNOSIS Your health care provider can usually diagnose a concussion based on a description of your injury and symptoms. He or she will ask whether you passed out (lost consciousness) and whether you are having trouble remembering events that happened right before and during your injury. Your evaluation might include:  A brain scan to look for signs of injury to the brain. Even if the test shows no injury, you may still have a concussion.  Blood tests to be sure other problems are not present. TREATMENT  Concussions are usually treated in an emergency department, in urgent  care, or at a clinic. You may need to stay in the hospital overnight for further treatment.  Tell your health care provider if you are taking any medicines, including prescription medicines, over-the-counter medicines, and natural remedies. Some medicines, such as blood thinners (anticoagulants) and aspirin, may increase the chance of complications. Also tell your health care provider whether you have had alcohol or  are taking illegal drugs. This information may affect treatment.  Your health care provider will send you home with important instructions to follow.  How fast you will recover from a concussion depends on many factors. These factors include how severe your concussion is, what part of your brain was injured, your age, and how healthy you were before the concussion.  Most people with mild injuries recover fully. Recovery can take time. In general, recovery is slower in older persons. Also, persons who have had a concussion in the past or have other medical problems may find that it takes longer to recover from their current injury. HOME CARE INSTRUCTIONS General Instructions  Carefully follow the directions your health care provider gave you.  Only take over-the-counter or prescription medicines for pain, discomfort, or fever as directed by your health care provider.  Take only those medicines that your health care provider has approved.  Do not drink alcohol until your health care provider says you are well enough to do so. Alcohol and certain other drugs may slow your recovery and can put you at risk of further injury.  If it is harder than usual to remember things, write them down.  If you are easily distracted, try to do one thing at a time. For example, do not try to watch TV while fixing dinner.  Talk with family members or close friends when making important decisions.  Keep all follow-up appointments. Repeated evaluation of your symptoms is recommended for your recovery.  Watch your symptoms and tell others to do the same. Complications sometimes occur after a concussion. Older adults with a brain injury may have a higher risk of serious complications, such as a blood clot on the brain.  Tell your teachers, school nurse, school counselor, coach, athletic trainer, or work Freight forwarder about your injury, symptoms, and restrictions. Tell them about what you can or cannot do. They should  watch for:  Increased problems with attention or concentration.  Increased difficulty remembering or learning new information.  Increased time needed to complete tasks or assignments.  Increased irritability or decreased ability to cope with stress.  Increased symptoms.  Rest. Rest helps the brain to heal. Make sure you:  Get plenty of sleep at night. Avoid staying up late at night.  Keep the same bedtime hours on weekends and weekdays.  Rest during the day. Take daytime naps or rest breaks when you feel tired.  Limit activities that require a lot of thought or concentration. These include:  Doing homework or job-related work.  Watching TV.  Working on the computer.  Avoid any situation where there is potential for another head injury (football, hockey, soccer, basketball, martial arts, downhill snow sports and horseback riding). Your condition will get worse every time you experience a concussion. You should avoid these activities until you are evaluated by the appropriate follow-up health care providers. Returning To Your Regular Activities You will need to return to your normal activities slowly, not all at once. You must give your body and brain enough time for recovery.  Do not return to sports or other athletic activities until your  health care provider tells you it is safe to do so.  Ask your health care provider when you can drive, ride a bicycle, or operate heavy machinery. Your ability to react may be slower after a brain injury. Never do these activities if you are dizzy.  Ask your health care provider about when you can return to work or school. Preventing Another Concussion It is very important to avoid another brain injury, especially before you have recovered. In rare cases, another injury can lead to permanent brain damage, brain swelling, or death. The risk of this is greatest during the first 7-10 days after a head injury. Avoid injuries by:  Wearing a seat  belt when riding in a car.  Drinking alcohol only in moderation.  Wearing a helmet when biking, skiing, skateboarding, skating, or doing similar activities.  Avoiding activities that could lead to a second concussion, such as contact or recreational sports, until your health care provider says it is okay.  Taking safety measures in your home.  Remove clutter and tripping hazards from floors and stairways.  Use grab bars in bathrooms and handrails by stairs.  Place non-slip mats on floors and in bathtubs.  Improve lighting in dim areas. SEEK MEDICAL CARE IF:  You have increased problems paying attention or concentrating.  You have increased difficulty remembering or learning new information.  You need more time to complete tasks or assignments than before.  You have increased irritability or decreased ability to cope with stress.  You have more symptoms than before. Seek medical care if you have any of the following symptoms for more than 2 weeks after your injury:  Lasting (chronic) headaches.  Dizziness or balance problems.  Nausea.  Vision problems.  Increased sensitivity to noise or light.  Depression or mood swings.  Anxiety or irritability.  Memory problems.  Difficulty concentrating or paying attention.  Sleep problems.  Feeling tired all the time. SEEK IMMEDIATE MEDICAL CARE IF:  You have severe or worsening headaches. These may be a sign of a blood clot in the brain.  You have weakness (even if only in one hand, leg, or part of the face).  You have numbness.  You have decreased coordination.  You vomit repeatedly.  You have increased sleepiness.  One pupil is larger than the other.  You have convulsions.  You have slurred speech.  You have increased confusion. This may be a sign of a blood clot in the brain.  You have increased restlessness, agitation, or irritability.  You are unable to recognize people or places.  You have neck  pain.  It is difficult to wake you up.  You have unusual behavior changes.  You lose consciousness. MAKE SURE YOU:  Understand these instructions.  Will watch your condition.  Will get help right away if you are not doing well or get worse. Document Released: 07/19/2003 Document Revised: 05/03/2013 Document Reviewed: 11/18/2012 [facility] Patient Information 2015 [place], [place]. This information is not intended to replace advice given to you by your health care provider. Make sure you discuss any questions you have with your health care provider.

## 2015-02-07 NOTE — ED Notes (Signed)
Patient transported to CT 

## 2015-02-07 NOTE — ED Provider Notes (Signed)
CSN: [id number]     Arrival date & time 02/07/15  1320 History   First MD Initiated Contact with Patient 02/07/15 1541     Chief Complaint  Patient presents with  . Fall     (Consider location/radiation/quality/duration/timing/severity/associated sxs/prior Treatment) HPI  Blood pressure 140/98, pulse 80, temperature 98.2 F (36.8 C), temperature source Oral, resp. rate 18, last menstrual period 02/26/2010, SpO2 99 %.  [name] is a 54 y.o. female with PMH of Asthma, hypothyroid, anxiety/depression c/o fall and possible loss of consciousness just prior to arrival. Patient states she was attempting to get her dog who escaped from car. She felt a pain in her right foot and right hip in the next injury were around her trying to get her up from off the ground. Patient is not anticoagulated. She states she felt a lightheaded sensation before she passed out. Patient has headache on the crown of her head which she describes as 4 out of 10 not exacerbated by movement, palpation, cough, no change in vision, dysarthria, ataxia, abdominal pain. Patient was ambulatory at the scene but with pain. States that she has a history of cervical fusion. On review of systems she notes a left lateral cervicalgia severe left gluteal pain with a numbness down the left extremity. She has chronic low back pain.  Patient states that she was considering coming to the emergency room yesterday for anxiety and depression, she takes medications for both of which are prescribed by her primary care she sees a Social worker. She states that her counselor was out of town and she consider coming to the ED. She denies suicidal ideation, homicidal ideation, auditory or visual hallucinations, alcohol or drug abuse.  Past Medical History  Diagnosis Date  . Asthma   . Hypertension   . Anxiety   . Depression   . Seasonal allergies   . Thyroid disease     hypothyroidism  . Hx of abnormal Pap smear 2006    no treatment to  cervix  . Heart murmur   . Panic attack 03/04/13  . Headache(784.0)   . Hypothyroidism   . Complication of anesthesia 2000    aspirated- had been sick prior to surgery- asthma  . Ruptured cervical disc   . Arthritis     hips/back  . Pneumonia   . Shortness of breath    Past Surgical History  Procedure Laterality Date  . Abdominal hysterectomy  02/2010    [facility]--Dr. Joan Flores  . Endometrial biopsy  10-10-09    Benign fragments of endometrial polyp  . Inguinal hernia repair      as an infant  . Liposuction  2006    thighs, chin  . Anterior and posterior repair N/A 04/05/2013    Procedure: ANTERIOR (CYSTOCELE) AND POSTERIOR REPAIR (RECTOCELE);  Surgeon: Arloa Koh, MD;  Location: [facility];  Service: Gynecology;  Laterality: N/A;  . Bladder suspension N/A 04/05/2013    Procedure: TRANSVAGINAL TAPE (TVT) PROCEDURE WITH CYSTO;  Surgeon: Arloa Koh, MD;  Location: [facility];  Service: Gynecology;  Laterality: N/A;  site- vagina and urethra  . Anterior cervical decomp/discectomy fusion N/A 03/03/2014    Procedure: ANTERIOR CERVICAL DECOMPRESSION/DISCECTOMY FUSION 2 LEVELS four/five, five/six;  Surgeon: Eustace Lamara Brecht, MD;  Location: [facility];  Service: Neurosurgery;  Laterality: N/A;   Family History  Problem Relation Age of Onset  . Brain cancer Mother     dec with brain & lung CA  . Lung cancer Mother   .  Diabetes Maternal Grandmother   . Diabetes Maternal Grandfather   . Diabetes Paternal Grandmother   . Diabetes Paternal Grandfather   . Bladder Cancer Father     dec age 59  . Prostate cancer Father    Social History  Substance Use Topics  . Smoking status: Never Smoker   . Smokeless tobacco: Never Used  . Alcohol Use: 1.5 oz/week    3 drink(s) per week     Comment: 3 glasses of wine per week   OB History    Gravida Para Term Preterm AB TAB SAB Ectopic Multiple Living   6 2 2  0 4  4   2      Review of Systems  10 systems reviewed and found to be negative,  except as noted in the HPI.   Allergies  Bactrim; Other; and Ciprofloxacin  Home Medications   Prior to Admission medications   Medication Sig Start Date End Date Taking? Authorizing Provider  ADVIL 200 MG tablet Take 200 mg by mouth 2 (two) times daily. 05/17/13  Yes Historical Provider, MD  albuterol (PROVENTIL HFA;VENTOLIN HFA) 108 (90 BASE) MCG/ACT inhaler Inhale 2 puffs into the lungs daily as needed for wheezing or shortness of breath (rescue inhaler).   Yes Historical Provider, MD  bisoprolol (ZEBETA) 5 MG tablet Take 2.5 mg by mouth daily.    Yes Historical Provider, MD  cetirizine (ZYRTEC) 10 MG tablet Take 10 mg by mouth daily.     Yes Historical Provider, MD  clonazePAM (KLONOPIN) 1 MG tablet Take 1 mg by mouth 3 (three) times daily as needed for anxiety.   Yes Historical Provider, MD  diphenhydrAMINE (BENADRYL) 25 MG tablet Take 25 mg by mouth every 6 (six) hours as needed for allergies.   Yes Historical Provider, MD  DULoxetine (CYMBALTA) 60 MG capsule Take 60 mg by mouth 2 (two) times daily.    Yes Historical Provider, MD  fluticasone (FLONASE) 50 MCG/ACT nasal spray Place 1 spray into both nostrils daily as needed for allergies or rhinitis.   Yes Historical Provider, MD  levothyroxine (SYNTHROID, LEVOTHROID) 88 MCG tablet Take 88 mcg by mouth daily before breakfast.   Yes Historical Provider, MD  mometasone (NASONEX) 50 MCG/ACT nasal spray Place 2 sprays into the nose 2 (two) times daily.    Yes Historical Provider, MD  montelukast (SINGULAIR) 10 MG tablet Take 10 mg by mouth at bedtime.   Yes Historical Provider, MD  Nutritional Supplements (ESTROVEN PO) Take 1 tablet by mouth daily.   Yes Historical Provider, MD  polyethylene glycol (MIRALAX / GLYCOLAX) packet Take 17 g by mouth daily as needed for mild constipation.    Yes Historical Provider, MD  Probiotic Product (PROBIOTIC FORMULA) CAPS Take 1 capsule by mouth daily.     Yes Historical Provider, MD  promethazine (PHENERGAN)  25 MG tablet Take 1 tablet by mouth every 6 (six) hours as needed for nausea.  03/17/13  Yes Historical Provider, MD  QVAR 80 MCG/ACT inhaler Inhale 2 puffs into the lungs 2 (two) times daily.  01/04/13  Yes Historical Provider, MD  SOLODYN 80 MG TB24 Take 1 tablet by mouth daily.  12/03/14  Yes Historical Provider, MD  traMADol (ULTRAM) 50 MG tablet Take 100 mg by mouth every 6 (six) hours as needed for moderate pain or severe pain.  04/02/13  Yes Historical Provider, MD  traZODone (DESYREL) 50 MG tablet Take 100 mg by mouth at bedtime.    Yes Historical Provider, MD  [facility]  2-PAK 0.3 MG/0.3ML SOAJ injection Inject 0.3 mg into the muscle as needed. 02/01/13   Historical Provider, MD  methocarbamol (ROBAXIN) 500 MG tablet Take 2 tablets (1,000 mg total) by mouth 4 (four) times daily as needed (Pain). 02/07/15   Nicole Pisciotta, PA-C  oxyCODONE-acetaminophen (PERCOCET/ROXICET) 5-325 MG per tablet Take 1-2 tablets by mouth every 4 (four) hours as needed for moderate pain. 03/04/14   Kristeen Miss, MD   BP 130/88 mmHg  Pulse 99  Temp(Src) 98.6 F (37 C) (Oral)  Resp 18  SpO2 92%  LMP 02/26/2010 Physical Exam  Constitutional: She is oriented to person, place, and time. She appears well-developed and well-nourished.  HENT:  Head: Normocephalic and atraumatic.  Mouth/Throat: Oropharynx is clear and moist.  No abrasions or contusions.   No hemotympanum, battle signs or raccoon's eyes  No crepitance or tenderness to palpation along the orbital rim.  EOMI intact with no pain or diplopia  No abnormal otorrhea or rhinorrhea. Nasal septum midline.  No intraoral trauma.  Eyes: Conjunctivae and EOM are normal. Pupils are equal, round, and reactive to light.  Neck: Normal range of motion. Neck supple.  + midline C-spine  tenderness to palpation No step-offs appreciated.   Grip/Biceps/Tricep strength 5/5 bilaterally, sensation to UE intact bilaterally.    Cardiovascular: Normal rate, regular  rhythm and intact distal pulses.   Pulmonary/Chest: Effort normal and breath sounds normal. No respiratory distress. She has no wheezes. She has no rales. She exhibits no tenderness.  No TTP or crepitance  Abdominal: Soft. Bowel sounds are normal. She exhibits no distension and no mass. There is no tenderness. There is no rebound and no guarding.  Musculoskeletal: Normal range of motion. She exhibits no edema or tenderness.  Vision has full active range of motion to left hip, no focal tenderness to palpation over left greater trochanter. She is distally neurovascularly intact over she reports a decrease in sensation and not a pins and needles paresthesia, she reports that left leg is 70% sensation of right leg.  Good ROM  Neurological: She is alert and oriented to person, place, and time.  Strength 5/5 x4 extremities   No point tenderness to percussion of lumbar spinal processes.  No TTP or paraspinal muscular spasm. Strength is 5 out of 5 to bilateral lower extremities at hip and knee; extensor hallucis longus 5 out of 5. Ankle strength 5 out of 5, no clonus, neurovascularly intact. No saddle anaesthesia. Patellar reflexes are 2+ bilaterally.      Skin: Skin is warm.  Psychiatric: She has a normal mood and affect.  Nursing note and vitals reviewed.   ED Course  Procedures (including critical care time) Labs Review Labs Reviewed  BASIC METABOLIC PANEL  CBC WITH DIFFERENTIAL/PLATELET  CBC WITH DIFFERENTIAL/PLATELET  Randolm Idol, ED    Imaging Review Dg Lumbar Spine Complete  02/07/2015   CLINICAL DATA:  Status post fall today with low back pain.  EXAM: LUMBAR SPINE - COMPLETE 4+ VIEW  COMPARISON:  None.  FINDINGS: There is no evidence of lumbar spine fracture. Alignment is normal. There is mild decreased intervertebral space noted lower lumbar spine. Extensive bowel content is identified in the visualized colon.  IMPRESSION: No acute fracture or dislocation.   Electronically  Signed   By: Abelardo Diesel M.D.   On: 02/07/2015 17:48   Ct Head Wo Contrast  02/07/2015   CLINICAL DATA:  Left leg gave out today and patient fell striking her head.  EXAM: CT HEAD WITHOUT  CONTRAST  CT CERVICAL SPINE WITHOUT CONTRAST  TECHNIQUE: Multidetector CT imaging of the head and cervical spine was performed following the standard protocol without intravenous contrast. Multiplanar CT image reconstructions of the cervical spine were also generated.  COMPARISON:  Cervical spine radiographs 06/13/2014  FINDINGS: CT HEAD FINDINGS  The ventricles are normal in size and configuration. No extra-axial fluid collections are identified. The gray-white differentiation is normal. No CT findings for acute intracranial process such as hemorrhage or infarction. No mass lesions. The brainstem and cerebellum are grossly normal.  The bony structures are intact. The paranasal sinuses and mastoid air cells are clear. The globes are intact.  CT CERVICAL SPINE FINDINGS  Anterior and interbody fusion hardware noted at C4-5 and C5-6. No complicating features associated with the hardware. No solid interbody fusion changes are demonstrated. The cervical vertebral bodies are normally aligned. No acute fracture. Mild facet disease but no facet or laminar fractures. The skullbase C1 and C1-2 articulations are maintained. The dens is intact. No abnormal prevertebral soft tissue swelling.  The lung apices are clear.  IMPRESSION: 1. No acute intracranial findings or skull fracture. 2. Anterior and interbody fusion hardware at C4-5 and E8-3 without complicating features. No solid interbody fusion changes are demonstrated. 3. No acute cervical spine fracture.   Electronically Signed   By: Marijo Sanes M.D.   On: 02/07/2015 17:14   Ct Cervical Spine Wo Contrast  02/07/2015   CLINICAL DATA:  Left leg gave out today and patient fell striking her head.  EXAM: CT HEAD WITHOUT CONTRAST  CT CERVICAL SPINE WITHOUT CONTRAST  TECHNIQUE:  Multidetector CT imaging of the head and cervical spine was performed following the standard protocol without intravenous contrast. Multiplanar CT image reconstructions of the cervical spine were also generated.  COMPARISON:  Cervical spine radiographs 06/13/2014  FINDINGS: CT HEAD FINDINGS  The ventricles are normal in size and configuration. No extra-axial fluid collections are identified. The gray-white differentiation is normal. No CT findings for acute intracranial process such as hemorrhage or infarction. No mass lesions. The brainstem and cerebellum are grossly normal.  The bony structures are intact. The paranasal sinuses and mastoid air cells are clear. The globes are intact.  CT CERVICAL SPINE FINDINGS  Anterior and interbody fusion hardware noted at C4-5 and C5-6. No complicating features associated with the hardware. No solid interbody fusion changes are demonstrated. The cervical vertebral bodies are normally aligned. No acute fracture. Mild facet disease but no facet or laminar fractures. The skullbase C1 and C1-2 articulations are maintained. The dens is intact. No abnormal prevertebral soft tissue swelling.  The lung apices are clear.  IMPRESSION: 1. No acute intracranial findings or skull fracture. 2. Anterior and interbody fusion hardware at C4-5 and T5-1 without complicating features. No solid interbody fusion changes are demonstrated. 3. No acute cervical spine fracture.   Electronically Signed   By: Marijo Sanes M.D.   On: 02/07/2015 17:14   Dg Hip Unilat With Pelvis 2-3 Views Left  02/07/2015   CLINICAL DATA:  [place] today.  Injured left hip.  EXAM: DG HIP (WITH OR WITHOUT PELVIS) 2-3V LEFT  COMPARISON:  None.  FINDINGS: Both hips are normally located. No acute fracture or plain film evidence of AVN. The pubic symphysis and SI joints are intact. No definite pelvic fractures.  IMPRESSION: No acute bony findings.   Electronically Signed   By: Marijo Sanes M.D.   On: 02/07/2015 17:47   I have  personally reviewed and evaluated these  images and lab results as part of my medical decision-making.   EKG Interpretation   Date/Time:  Wednesday February 07 2015 13:43:22 EDT Ventricular Rate:  75 PR Interval:  217 QRS Duration: 68 QT Interval:  385 QTC Calculation: 430 R Axis:   0 Text Interpretation:  Sinus rhythm Borderline prolonged PR interval  Consider right atrial enlargement Indeterminate axis Baseline wander in  lead(s) V3 V4 no significant change since 2014 Confirmed by GOLDSTON  MD,  SCOTT ([id number]) on 02/07/2015 1:48:57 PM      MDM   Final diagnoses:  Syncope and collapse  Hip strain, left, initial encounter  Concussion, with loss of consciousness of unspecified duration, initial encounter   Filed Vitals:   02/07/15 1333 02/07/15 1625 02/07/15 1841 02/07/15 1842  BP: 140/98 152/90 130/88 130/88  Pulse: 80 85 63 99  Temp: 98.2 F (36.8 C) 98.1 F (36.7 C)  98.6 F (37 C)  TempSrc: Oral Oral  Oral  Resp: 18 20 18    SpO2: 99% 99% 92% 92%    Medications  sodium chloride 0.9 % bolus 1,000 mL (1,000 mLs Intravenous New Bag/Given 02/07/15 1800)  HYDROcodone-acetaminophen (NORCO/VICODIN) 5-325 MG per tablet 2 tablet (2 tablets Oral Given 02/07/15 1650)    [name] is a pleasant 54 y.o. female presenting with fall and possible syncopal event. Patient has mild headache. Neuro exam is nonfocal. She reports a decreased sensation in the left lower extremity. Plain film of lumbar spine and left upper negative, CT head and neck are negative. EKG no ischemia or arrhythmia. Blood work reassuring. Patient will be given concussion precautions for pain.   Evaluation does not show pathology that would require ongoing emergent intervention or inpatient treatment. Pt is hemodynamically stable and mentating appropriately. Discussed findings and plan with patient/guardian, who agrees with care plan. All questions answered. Return precautions discussed and outpatient follow  up given.   New Prescriptions   METHOCARBAMOL (ROBAXIN) 500 MG TABLET    Take 2 tablets (1,000 mg total) by mouth 4 (four) times daily as needed (Pain).         Monico Blitz, PA-C 02/07/15 [place], DO 02/07/15 2343

## 2015-02-20 ENCOUNTER — Telehealth: Payer: Self-pay | Admitting: Obstetrics and Gynecology

## 2015-02-20 NOTE — Telephone Encounter (Signed)
Thank you. Encounter closed. 

## 2015-02-20 NOTE — Telephone Encounter (Signed)
Patient called and cancelled her AEX with Dr. Quincy Simmonds for 02/23/15 due to having her exam with her primary care physician this year. She will call back and schedule with Dr. Quincy Simmonds for next year when she has her calendar. Recall for AEX entered for 02/15/16.

## 2015-02-23 ENCOUNTER — Ambulatory Visit: Payer: BC Managed Care – PPO | Admitting: Obstetrics and Gynecology

## 2015-02-26 ENCOUNTER — Emergency Department (HOSPITAL_BASED_OUTPATIENT_CLINIC_OR_DEPARTMENT_OTHER): Payer: 59

## 2015-02-26 ENCOUNTER — Emergency Department (HOSPITAL_BASED_OUTPATIENT_CLINIC_OR_DEPARTMENT_OTHER)
Admission: EM | Admit: 2015-02-26 | Discharge: 2015-02-26 | Disposition: A | Discharge status: Home / Self Care | Payer: 59 | Attending: Emergency Medicine | Admitting: Emergency Medicine

## 2015-02-26 ENCOUNTER — Encounter (HOSPITAL_BASED_OUTPATIENT_CLINIC_OR_DEPARTMENT_OTHER): Payer: Self-pay

## 2015-02-26 DIAGNOSIS — R200 Anesthesia of skin: Secondary | ICD-10-CM | POA: Diagnosis not present

## 2015-02-26 DIAGNOSIS — E039 Hypothyroidism, unspecified: Secondary | ICD-10-CM | POA: Diagnosis not present

## 2015-02-26 DIAGNOSIS — Z79899 Other long term (current) drug therapy: Secondary | ICD-10-CM | POA: Diagnosis not present

## 2015-02-26 DIAGNOSIS — Z8701 Personal history of pneumonia (recurrent): Secondary | ICD-10-CM | POA: Diagnosis not present

## 2015-02-26 DIAGNOSIS — F600 Paranoid personality disorder: Secondary | ICD-10-CM | POA: Insufficient documentation

## 2015-02-26 DIAGNOSIS — F410 Panic disorder [episodic paroxysmal anxiety] without agoraphobia: Secondary | ICD-10-CM | POA: Diagnosis not present

## 2015-02-26 DIAGNOSIS — M542 Cervicalgia: Secondary | ICD-10-CM | POA: Diagnosis not present

## 2015-02-26 DIAGNOSIS — F329 Major depressive disorder, single episode, unspecified: Secondary | ICD-10-CM | POA: Diagnosis not present

## 2015-02-26 DIAGNOSIS — R011 Cardiac murmur, unspecified: Secondary | ICD-10-CM | POA: Insufficient documentation

## 2015-02-26 DIAGNOSIS — R51 Headache: Secondary | ICD-10-CM | POA: Diagnosis present

## 2015-02-26 DIAGNOSIS — M25552 Pain in left hip: Secondary | ICD-10-CM | POA: Diagnosis not present

## 2015-02-26 DIAGNOSIS — R42 Dizziness and giddiness: Secondary | ICD-10-CM | POA: Insufficient documentation

## 2015-02-26 DIAGNOSIS — J45909 Unspecified asthma, uncomplicated: Secondary | ICD-10-CM | POA: Diagnosis not present

## 2015-02-26 DIAGNOSIS — R519 Headache, unspecified: Secondary | ICD-10-CM

## 2015-02-26 DIAGNOSIS — H53149 Visual discomfort, unspecified: Secondary | ICD-10-CM | POA: Diagnosis not present

## 2015-02-26 DIAGNOSIS — I10 Essential (primary) hypertension: Secondary | ICD-10-CM | POA: Diagnosis not present

## 2015-02-26 MED ORDER — ACETAMINOPHEN 500 MG PO TABS
1000.0000 mg | ORAL_TABLET | Freq: Once | ORAL | Status: AC
  Administered 2015-02-26: 1000 mg via ORAL
  Filled 2015-02-26: qty 2

## 2015-02-26 MED ORDER — CLONAZEPAM 1 MG PO TABS
1.0000 mg | ORAL_TABLET | Freq: Once | ORAL | Status: DC
  Filled 2015-02-26: qty 1

## 2015-02-26 MED ORDER — DIPHENHYDRAMINE HCL 25 MG PO CAPS
25.0000 mg | ORAL_CAPSULE | Freq: Once | ORAL | Status: AC
  Administered 2015-02-26: 25 mg via ORAL
  Filled 2015-02-26: qty 1

## 2015-02-26 MED ORDER — METOCLOPRAMIDE HCL 10 MG PO TABS
10.0000 mg | ORAL_TABLET | Freq: Once | ORAL | Status: AC
Start: 2015-02-26 — End: 2015-02-26
  Administered 2015-02-26: 10 mg via ORAL
  Filled 2015-02-26: qty 1

## 2015-02-26 NOTE — ED Provider Notes (Signed)
CSN: [id number]     Arrival date & time 02/26/15  1313 History   First MD Initiated Contact with Patient 02/26/15 1453     Chief Complaint  Patient presents with  . Concussion     (Consider location/radiation/quality/duration/timing/severity/associated sxs/prior Treatment) HPI   Patient is a 54 year old female with anxiety, depression and recent fall with head trauma due to syncope, evaluated in our ER at the end of September. She presents to the ER today for intermittent headaches and left hip pain.  She states that she has frequent headaches usually located in her forehead and across the top of her head, with associated photosensitivity, intermittent confusion, and forgetfulness and blurry vision.  Patient is also complaining of left hip pain with some radiation down her left leg.  She denies any recent injury and there is no swelling or redness.  She is able to walk without difficulty or pain in her left However sitting and standing to cause her more pain.  She also complains severe anxiety and depression exacerbated by many recent stressors in her life,  including ex-husband who is accusing her of being a bad mother, her husband who is also telling her she is being traumatic and "crazy" for coming back to the ER.    Past Medical History  Diagnosis Date  . Asthma   . Hypertension   . Anxiety   . Depression   . Seasonal allergies   . Thyroid disease     hypothyroidism  . Hx of abnormal Pap smear 2006    no treatment to cervix  . Heart murmur   . Panic attack 03/04/13  . Headache(784.0)   . Hypothyroidism   . Complication of anesthesia 2000    aspirated- had been sick prior to surgery- asthma  . Ruptured cervical disc   . Arthritis     hips/back  . Pneumonia   . Shortness of breath    Past Surgical History  Procedure Laterality Date  . Abdominal hysterectomy  02/2010    [facility]--Dr. Joan Flores  . Endometrial biopsy  10-10-09    Benign fragments of endometrial polyp  .  Inguinal hernia repair      as an infant  . Liposuction  2006    thighs, chin  . Anterior and posterior repair N/A 04/05/2013    Procedure: ANTERIOR (CYSTOCELE) AND POSTERIOR REPAIR (RECTOCELE);  Surgeon: Arloa Koh, MD;  Location: [facility];  Service: Gynecology;  Laterality: N/A;  . Bladder suspension N/A 04/05/2013    Procedure: TRANSVAGINAL TAPE (TVT) PROCEDURE WITH CYSTO;  Surgeon: Arloa Koh, MD;  Location: [facility];  Service: Gynecology;  Laterality: N/A;  site- vagina and urethra  . Anterior cervical decomp/discectomy fusion N/A 03/03/2014    Procedure: ANTERIOR CERVICAL DECOMPRESSION/DISCECTOMY FUSION 2 LEVELS four/five, five/six;  Surgeon: Eustace Moore, MD;  Location: [facility];  Service: Neurosurgery;  Laterality: N/A;   Family History  Problem Relation Age of Onset  . Brain cancer Mother     dec with brain & lung CA  . Lung cancer Mother   . Diabetes Maternal Grandmother   . Diabetes Maternal Grandfather   . Diabetes Paternal Grandmother   . Diabetes Paternal Grandfather   . Bladder Cancer Father     dec age 47  . Prostate cancer Father    Social History  Substance Use Topics  . Smoking status: Never Smoker   . Smokeless tobacco: Never Used  . Alcohol Use: 1.5 oz/week    3 drink(s) per  week     Comment: 3 glasses of wine per week   OB History    Gravida Para Term Preterm AB TAB SAB Ectopic Multiple Living   6 2 2  0 4  4   2      Review of Systems  Constitutional: Negative.  Negative for fever, chills, diaphoresis, appetite change and fatigue.  Eyes: Positive for photophobia. Negative for pain, discharge, redness and itching.  Respiratory: Negative for apnea, cough, choking, chest tightness, shortness of breath, wheezing and stridor.   Cardiovascular: Negative.  Negative for chest pain, palpitations and leg swelling.  Gastrointestinal: Negative.  Negative for nausea, vomiting, diarrhea and rectal pain.  Endocrine: Negative.   Genitourinary: Negative.    Musculoskeletal: Positive for arthralgias and neck pain. Negative for back pain.  Skin: Negative.   Neurological: Positive for dizziness, light-headedness, numbness and headaches. Negative for tremors, seizures, syncope, facial asymmetry, speech difficulty and weakness.  Psychiatric/Behavioral: Negative for suicidal ideas, sleep disturbance and self-injury. The patient is nervous/anxious.       Allergies  Bactrim; Other; and Ciprofloxacin  Home Medications   Prior to Admission medications   Medication Sig Start Date End Date Taking? Authorizing Provider  ADVIL 200 MG tablet Take 200 mg by mouth 2 (two) times daily. 05/17/13   Historical Provider, MD  albuterol (PROVENTIL HFA;VENTOLIN HFA) 108 (90 BASE) MCG/ACT inhaler Inhale 2 puffs into the lungs daily as needed for wheezing or shortness of breath (rescue inhaler).    Historical Provider, MD  bisoprolol (ZEBETA) 5 MG tablet Take 2.5 mg by mouth daily.     Historical Provider, MD  cetirizine (ZYRTEC) 10 MG tablet Take 10 mg by mouth daily.      Historical Provider, MD  clonazePAM (KLONOPIN) 1 MG tablet Take 1 mg by mouth 3 (three) times daily as needed for anxiety.    Historical Provider, MD  diphenhydrAMINE (BENADRYL) 25 MG tablet Take 25 mg by mouth every 6 (six) hours as needed for allergies.    Historical Provider, MD  DULoxetine (CYMBALTA) 60 MG capsule Take 60 mg by mouth 2 (two) times daily.     Historical Provider, MD  EPIPEN 2-PAK 0.3 MG/0.3ML SOAJ injection Inject 0.3 mg into the muscle as needed. 02/01/13   Historical Provider, MD  fluticasone (FLONASE) 50 MCG/ACT nasal spray Place 1 spray into both nostrils daily as needed for allergies or rhinitis.    Historical Provider, MD  levothyroxine (SYNTHROID, LEVOTHROID) 88 MCG tablet Take 88 mcg by mouth daily before breakfast.    Historical Provider, MD  methocarbamol (ROBAXIN) 500 MG tablet Take 2 tablets (1,000 mg total) by mouth 4 (four) times daily as needed (Pain). 02/07/15    Nicole Pisciotta, PA-C  mometasone (NASONEX) 50 MCG/ACT nasal spray Place 2 sprays into the nose 2 (two) times daily.     Historical Provider, MD  montelukast (SINGULAIR) 10 MG tablet Take 10 mg by mouth at bedtime.    Historical Provider, MD  Nutritional Supplements (ESTROVEN PO) Take 1 tablet by mouth daily.    Historical Provider, MD  oxyCODONE-acetaminophen (PERCOCET/ROXICET) 5-325 MG per tablet Take 1-2 tablets by mouth every 4 (four) hours as needed for moderate pain. 03/04/14   Kristeen Miss, MD  polyethylene glycol [facility] / GLYCOLAX) packet Take 17 g by mouth daily as needed for mild constipation.     Historical Provider, MD  Probiotic Product (PROBIOTIC FORMULA) CAPS Take 1 capsule by mouth daily.      Historical Provider, MD  promethazine (PHENERGAN) 25  MG tablet Take 1 tablet by mouth every 6 (six) hours as needed for nausea.  03/17/13   Historical Provider, MD  QVAR 80 MCG/ACT inhaler Inhale 2 puffs into the lungs 2 (two) times daily.  01/04/13   Historical Provider, MD  SOLODYN 80 MG TB24 Take 1 tablet by mouth daily.  12/03/14   Historical Provider, MD  traMADol (ULTRAM) 50 MG tablet Take 100 mg by mouth every 6 (six) hours as needed for moderate pain or severe pain.  04/02/13   Historical Provider, MD  traZODone (DESYREL) 50 MG tablet Take 100 mg by mouth at bedtime.     Historical Provider, MD   BP 140/96 mmHg  Pulse 79  Temp(Src) 98.6 F (37 C) (Oral)  Resp 18  Ht 5\' 11"  (1.803 m)  Wt 189 lb (85.73 kg)  BMI 26.37 kg/m2  SpO2 98%  LMP 02/26/2010 Physical Exam  Constitutional: She is oriented to person, place, and time. She appears well-developed and well-nourished. No distress.  HENT:  Head: Normocephalic and atraumatic.  Nose: Nose normal.  Mouth/Throat: Oropharynx is clear and moist. No oropharyngeal exudate.  Eyes: Conjunctivae, EOM and lids are normal. Pupils are equal, round, and reactive to light. Right eye exhibits no discharge. Left eye exhibits no discharge. No  scleral icterus. Right eye exhibits normal extraocular motion and no nystagmus. Left eye exhibits normal extraocular motion and no nystagmus.  No nystagmus with positional changes or EOMs  Neck: Normal range of motion. No JVD present. No tracheal deviation present. No thyromegaly present.  Cardiovascular: Normal rate, regular rhythm, normal heart sounds and intact distal pulses.  Exam reveals no gallop and no friction rub.   No murmur heard. Pulmonary/Chest: Effort normal and breath sounds normal. No accessory muscle usage. No respiratory distress. She has no decreased breath sounds. She has no wheezes. She has no rhonchi. She has no rales. She exhibits no tenderness.  Abdominal: Soft. Bowel sounds are normal. She exhibits no distension and no mass. There is no tenderness. There is no rebound and no guarding.  Musculoskeletal: Normal range of motion. She exhibits no edema.       Left hip: She exhibits tenderness. She exhibits normal range of motion, normal strength, no bony tenderness, no swelling, no crepitus and no deformity.  Lymphadenopathy:    She has no cervical adenopathy.  Neurological: She is alert and oriented to person, place, and time. She has normal reflexes. No cranial nerve deficit. She exhibits normal muscle tone. Coordination normal.  Speech is clear and goal oriented, follows commands Major Cranial nerves without deficit, no facial droop Normal strength in upper and lower extremities bilaterally including dorsiflexion and plantar flexion, strong and equal grip strength Sensation normal to light and sharp touch Moves extremities without ataxia, coordination intact Normal finger to nose and rapid alternating movements Neg romberg, no pronator drift Normal gait and balance   Skin: Skin is warm and dry. No rash noted. She is not diaphoretic. No erythema. No pallor.  Psychiatric: Her behavior is normal. Judgment normal. Her mood appears anxious. Her affect is labile. Her speech is  tangential. Her speech is not delayed and not slurred. Thought content is paranoid. Cognition and memory are normal. Cognition and memory are not impaired. She expresses no homicidal and no suicidal ideation. She expresses no suicidal plans and no homicidal plans.  Nursing note and vitals reviewed.   ED Course  Procedures (including critical care time) Labs Review Labs Reviewed - No data to display  Imaging  Review Dg Hip Unilat With Pelvis 2-3 Views Left  02/26/2015  CLINICAL DATA:  Fall 3 weeks ago. Left hip injury and persistent pain. Subsequent encounter. EXAM: DG HIP (WITH OR WITHOUT PELVIS) 2-3V LEFT COMPARISON:  02/07/2015 FINDINGS: There is no evidence of hip fracture or dislocation. There is no evidence of arthropathy or other focal bone abnormality. IMPRESSION: Negative. Electronically Signed   By: Earle Gell M.D.   On: 02/26/2015 16:34   I have personally reviewed and evaluated these images and lab results as part of my medical decision-making.   EKG Interpretation None      MDM   Final diagnoses:  Nonintractable headache, unspecified chronicity pattern, unspecified headache type  Left hip pain    Pt with ER visit 02/07/15 for possible syncope with head trauma, presents to the ED today for continuing headaches with multiple other complaints.  She admits she has difficulty with anxiety depression, she is seeing her counselor tomorrow. Throughout her stay she has made several statements stating that her husband, and ex-husband, and friends Called her "crazy" her stated that she "is that mother."  At one point she stated she has asked her husband to leave, and he will not leave the home.  She also stated several times that her father once had a television fall on his head and that several weeks later he had brain surgery for subdural hematoma, this appears to be one of her main concerns with her headache symptoms after having a closed head injury several weeks ago. Given her  complex psych history, and a difficult historian, plan was made to have her take her home dose of Klonopin which she was due for, and then do a thorough neurological evaluation.  The patient did not have a ride home and nursing staff was unable to administer her home dose.  She had a normal neurological exam without any deficits, her vision was tested and was having normal.  Throughout her stay she did continue to add multiple complaints such as left hip pain.  A left hip x-ray was obtained which was negative for acute pathology.  She was given a headache cocktail and was educated about postconcussion syndrome which is very consistent with her multiple symptoms.  She is advised to limit screen time and TV watching.  She was encouraged to keep her appointment tomorrow with her therapist to discuss her recent stressors.   Patient denied SI, HI, AVH.  She admitted to significant anxiety.  She agreed to take home meds when she got home safely.    Medications  diphenhydrAMINE (BENADRYL) capsule 25 mg (25 mg Oral Given 02/26/15 1634)  metoCLOPramide (REGLAN) tablet 10 mg (10 mg Oral Given 02/26/15 1634)  acetaminophen (TYLENOL) tablet 1,000 mg (1,000 mg Oral Given 02/26/15 1634)   Filed Vitals:   02/26/15 1329  BP: 140/96  Pulse: 79  Temp: 98.6 F (37 C)  TempSrc: Oral  Resp: 18  Height: 5\' 11"  (1.803 m)  Weight: 189 lb (85.73 kg)  SpO2: 98%       Delsa Grana, PA-C 02/26/15 1758  Blanchie Dessert, MD 02/27/15 [phone]

## 2015-02-26 NOTE — ED Notes (Signed)
Pa made aware klonopin not available here

## 2015-02-26 NOTE — ED Notes (Signed)
Dx with concussion after a fall/ head injury 9/28-pt c/o cont'd confusion,HA,forgetfullness, blurred vision-pt is A/O and answering all ?s in great detail w/o distress-steady gait

## 2015-02-26 NOTE — Discharge Instructions (Signed)
Post-Concussion Syndrome Post-concussion syndrome describes the symptoms that can occur after a head injury. These symptoms can last from weeks to months. CAUSES  It is not clear why some head injuries cause post-concussion syndrome. It can occur whether your head injury was mild or severe and whether you were wearing head protection or not.  SIGNS AND SYMPTOMS  Memory difficulties.  Dizziness.  Headaches.  Double vision or blurry vision.  Sensitivity to light.  Hearing difficulties.  Depression.  Tiredness.  Weakness.  Difficulty with concentration.  Difficulty sleeping or staying asleep.  Vomiting.  Poor balance or instability on your feet.  Slow reaction time.  Difficulty learning and remembering things you have heard.   DIAGNOSIS  There is no test to determine whether you have post-concussion syndrome, it is a clinical diagnosis. TREATMENT  Usually, these problems disappear over time without medical care. Your health care provider may prescribe medicine to help ease your symptoms. It is important to follow up with a neurologist to evaluate your recovery and address any lingering symptoms or issues. HOME CARE INSTRUCTIONS   Take medicines only as directed by your health care provider. Do not take aspirin. Aspirin can slow blood clotting.  Sleep with your head slightly elevated to help with headaches.  Avoid any situation where there is potential for another head injury. This includes football, hockey, soccer, basketball, martial arts, downhill snow sports, and horseback riding. Your condition will get worse every time you experience a concussion. You should avoid these activities until you are evaluated by the appropriate follow-up health care providers.  Keep all follow-up visits as directed by your health care provider. This is important. SEEK MEDICAL CARE IF:  You have increased problems paying attention or concentrating.  You have increased difficulty  remembering or learning new information.  You need more time to complete tasks or assignments than before.  You have increased irritability or decreased ability to cope with stress.  You have more symptoms than before. Seek medical care if you have any of the following symptoms for more than two weeks after your injury:  Lasting (chronic) headaches.  Dizziness or balance problems.  Nausea.  Vision problems.  Increased sensitivity to noise or light.  Depression or mood swings.  Anxiety or irritability.  Memory problems.  Difficulty concentrating or paying attention.  Sleep problems.  Feeling tired all the time. SEEK IMMEDIATE MEDICAL CARE IF:  You have confusion or unusual drowsiness.  Others find it difficult to wake you up.  You have nausea or persistent, forceful vomiting.  You feel like you are moving when you are not (vertigo). Your eyes may move rapidly back and forth.  You have convulsions or faint.  You have severe, persistent headaches that are not relieved by medicine.  You cannot use your arms or legs normally.  One of your pupils is larger than the other.  You have clear or bloody discharge from your nose or ears.  Your problems are getting worse, not better. MAKE SURE YOU:  Understand these instructions.  Will watch your condition.  Will get help right away if you are not doing well or get worse.   This information is not intended to replace advice given to you by your health care provider. Make sure you discuss any questions you have with your health care provider.   Document Released: 10/18/2001 Document Revised: 05/19/2014 Document Reviewed: 08/03/2013 Elsevier Interactive Patient Education 2016 [place] Joint pain, which is also called arthralgia, can  be caused by many things. Joint pain often goes away when you follow your health care provider's instructions for relieving pain at home. However, joint pain can also  be caused by conditions that require further treatment. Common causes of joint pain include:  Bruising in the area of the joint.  Overuse of the joint.  Wear and tear on the joints that occur with aging (osteoarthritis).  Various other forms of arthritis.  A buildup of a crystal form of uric acid in the joint (gout).  Infections of the joint (septic arthritis) or of the bone (osteomyelitis). Your health care provider may recommend medicine to help with the pain. If your joint pain continues, additional tests may be needed to diagnose your condition. HOME CARE INSTRUCTIONS Watch your condition for any changes. Follow these instructions as directed to lessen the pain that you are feeling.  Take medicines only as directed by your health care provider.  Rest the affected area for as long as your health care provider says that you should. If directed to do so, raise the painful joint above the level of your heart while you are sitting or lying down.  Do not do things that cause or worsen pain.  If directed, apply ice to the painful area:  Put ice in a plastic bag.  Place a towel between your skin and the bag.  Leave the ice on for 20 minutes, 2-3 times per day.  Wear an elastic bandage, splint, or sling as directed by your health care provider. Loosen the elastic bandage or splint if your fingers or toes become numb and tingle, or if they turn cold and blue.  Begin exercising or stretching the affected area as directed by your health care provider. Ask your health care provider what types of exercise are safe for you.  Keep all follow-up visits as directed by your health care provider. This is important. SEEK MEDICAL CARE IF:  Your pain increases, and medicine does not help.  Your joint pain does not improve within 3 days.  You have increased bruising or swelling.  You have a fever.  You lose 10 lb (4.5 kg) or more without trying. SEEK IMMEDIATE MEDICAL CARE IF:  You are  not able to move the joint.  Your fingers or toes become numb or they turn cold and blue.   This information is not intended to replace advice given to you by your health care provider. Make sure you discuss any questions you have with your health care provider.   Document Released: 04/28/2005 Document Revised: 05/19/2014 Document Reviewed: 02/07/2014 Elsevier Interactive Patient Education [employer].

## 2015-02-26 NOTE — ED Notes (Signed)
Pt does not have a ride

## 2015-02-26 NOTE — ED Notes (Signed)
Pa  at bedside. 

## 2015-04-25 ENCOUNTER — Other Ambulatory Visit: Payer: Self-pay | Admitting: Physician Assistant

## 2015-04-25 DIAGNOSIS — R1030 Lower abdominal pain, unspecified: Secondary | ICD-10-CM

## 2015-05-15 ENCOUNTER — Other Ambulatory Visit: Payer: 59

## 2015-06-06 ENCOUNTER — Other Ambulatory Visit: Payer: Self-pay | Admitting: Specialist

## 2015-06-06 DIAGNOSIS — H539 Unspecified visual disturbance: Secondary | ICD-10-CM

## 2015-06-13 ENCOUNTER — Ambulatory Visit
Admission: RE | Admit: 2015-06-13 | Discharge: 2015-06-13 | Disposition: A | Discharge status: Home / Self Care | Payer: BLUE CROSS/BLUE SHIELD | Source: Ambulatory Visit | Attending: Specialist | Admitting: Specialist

## 2015-06-13 DIAGNOSIS — H539 Unspecified visual disturbance: Secondary | ICD-10-CM

## 2015-06-13 MED ORDER — GADOBENATE DIMEGLUMINE 529 MG/ML IV SOLN
18.0000 mL | Freq: Once | INTRAVENOUS | Status: AC | PRN
  Administered 2015-06-13: 18 mL via INTRAVENOUS

## 2015-07-12 ENCOUNTER — Ambulatory Visit
Admission: RE | Admit: 2015-07-12 | Discharge: 2015-07-12 | Disposition: A | Discharge status: Home / Self Care | Payer: BLUE CROSS/BLUE SHIELD | Source: Ambulatory Visit | Attending: Physician Assistant | Admitting: Physician Assistant

## 2015-07-12 DIAGNOSIS — R1030 Lower abdominal pain, unspecified: Secondary | ICD-10-CM

## 2015-07-12 MED ORDER — IOPAMIDOL (ISOVUE-300) INJECTION 61%
100.0000 mL | Freq: Once | INTRAVENOUS | Status: AC | PRN
Start: 2015-07-12 — End: 2015-07-12
  Administered 2015-07-12: 100 mL via INTRAVENOUS

## 2016-07-07 ENCOUNTER — Emergency Department (HOSPITAL_COMMUNITY): Payer: BLUE CROSS/BLUE SHIELD

## 2016-07-07 ENCOUNTER — Inpatient Hospital Stay (HOSPITAL_COMMUNITY)
Admission: EM | Admit: 2016-07-07 | Discharge: 2016-07-12 | DRG: 193 | Disposition: A | Discharge status: Home / Self Care | Payer: BLUE CROSS/BLUE SHIELD | Attending: Internal Medicine | Admitting: Internal Medicine

## 2016-07-07 ENCOUNTER — Encounter (HOSPITAL_COMMUNITY): Payer: Self-pay

## 2016-07-07 DIAGNOSIS — J45901 Unspecified asthma with (acute) exacerbation: Secondary | ICD-10-CM | POA: Diagnosis present

## 2016-07-07 DIAGNOSIS — G4700 Insomnia, unspecified: Secondary | ICD-10-CM | POA: Diagnosis present

## 2016-07-07 DIAGNOSIS — J181 Lobar pneumonia, unspecified organism: Secondary | ICD-10-CM | POA: Diagnosis not present

## 2016-07-07 DIAGNOSIS — Z981 Arthrodesis status: Secondary | ICD-10-CM

## 2016-07-07 DIAGNOSIS — K5900 Constipation, unspecified: Secondary | ICD-10-CM | POA: Diagnosis present

## 2016-07-07 DIAGNOSIS — I10 Essential (primary) hypertension: Secondary | ICD-10-CM | POA: Diagnosis present

## 2016-07-07 DIAGNOSIS — Z888 Allergy status to other drugs, medicaments and biological substances status: Secondary | ICD-10-CM

## 2016-07-07 DIAGNOSIS — Z91048 Other nonmedicinal substance allergy status: Secondary | ICD-10-CM

## 2016-07-07 DIAGNOSIS — J180 Bronchopneumonia, unspecified organism: Secondary | ICD-10-CM

## 2016-07-07 DIAGNOSIS — J189 Pneumonia, unspecified organism: Secondary | ICD-10-CM

## 2016-07-07 DIAGNOSIS — J4531 Mild persistent asthma with (acute) exacerbation: Secondary | ICD-10-CM | POA: Diagnosis not present

## 2016-07-07 DIAGNOSIS — Z79899 Other long term (current) drug therapy: Secondary | ICD-10-CM

## 2016-07-07 DIAGNOSIS — E039 Hypothyroidism, unspecified: Secondary | ICD-10-CM | POA: Diagnosis present

## 2016-07-07 DIAGNOSIS — J9601 Acute respiratory failure with hypoxia: Secondary | ICD-10-CM | POA: Diagnosis present

## 2016-07-07 DIAGNOSIS — F329 Major depressive disorder, single episode, unspecified: Secondary | ICD-10-CM | POA: Diagnosis present

## 2016-07-07 DIAGNOSIS — R0602 Shortness of breath: Secondary | ICD-10-CM | POA: Diagnosis not present

## 2016-07-07 DIAGNOSIS — F419 Anxiety disorder, unspecified: Secondary | ICD-10-CM | POA: Diagnosis present

## 2016-07-07 DIAGNOSIS — J9600 Acute respiratory failure, unspecified whether with hypoxia or hypercapnia: Secondary | ICD-10-CM

## 2016-07-07 DIAGNOSIS — R0600 Dyspnea, unspecified: Secondary | ICD-10-CM

## 2016-07-07 DIAGNOSIS — Z91018 Allergy to other foods: Secondary | ICD-10-CM

## 2016-07-07 DIAGNOSIS — F418 Other specified anxiety disorders: Secondary | ICD-10-CM

## 2016-07-07 DIAGNOSIS — G8929 Other chronic pain: Secondary | ICD-10-CM | POA: Diagnosis present

## 2016-07-07 HISTORY — DX: Bronchopneumonia, unspecified organism: J18.0

## 2016-07-07 HISTORY — DX: Low back pain: M54.5

## 2016-07-07 HISTORY — DX: Other chronic pain: G89.29

## 2016-07-07 HISTORY — DX: Low back pain, unspecified: M54.50

## 2016-07-07 HISTORY — DX: Migraine, unspecified, not intractable, without status migrainosus: G43.909

## 2016-07-07 HISTORY — DX: Unspecified chronic bronchitis: J42

## 2016-07-07 LAB — CBC WITH DIFFERENTIAL/PLATELET
BASOS ABS: 0 10*3/uL (ref 0.0–0.1)
BASOS PCT: 0 %
EOS ABS: 0 10*3/uL (ref 0.0–0.7)
Eosinophils Relative: 0 %
HEMATOCRIT: 43.2 % (ref 36.0–46.0)
HEMOGLOBIN: 14 g/dL (ref 12.0–15.0)
Lymphocytes Relative: 24 %
Lymphs Abs: 1.7 10*3/uL (ref 0.7–4.0)
MCH: 28.2 pg (ref 26.0–34.0)
MCHC: 32.4 g/dL (ref 30.0–36.0)
MCV: 87.1 fL (ref 78.0–100.0)
Monocytes Absolute: 0.6 10*3/uL (ref 0.1–1.0)
Monocytes Relative: 9 %
NEUTROS ABS: 4.7 10*3/uL (ref 1.7–7.7)
NEUTROS PCT: 67 %
Platelets: 139 10*3/uL — ABNORMAL LOW (ref 150–400)
RBC: 4.96 MIL/uL (ref 3.87–5.11)
RDW: 13.9 % (ref 11.5–15.5)
WBC: 7 10*3/uL (ref 4.0–10.5)

## 2016-07-07 LAB — RESPIRATORY PANEL BY PCR
Adenovirus: NOT DETECTED
BORDETELLA PERTUSSIS-RVPCR: NOT DETECTED
CHLAMYDOPHILA PNEUMONIAE-RVPPCR: NOT DETECTED
CORONAVIRUS 229E-RVPPCR: NOT DETECTED
CORONAVIRUS HKU1-RVPPCR: NOT DETECTED
Coronavirus NL63: NOT DETECTED
Coronavirus OC43: NOT DETECTED
Influenza A: NOT DETECTED
Influenza B: NOT DETECTED
MYCOPLASMA PNEUMONIAE-RVPPCR: NOT DETECTED
Metapneumovirus: DETECTED — AB
PARAINFLUENZA VIRUS 3-RVPPCR: NOT DETECTED
Parainfluenza Virus 1: NOT DETECTED
Parainfluenza Virus 2: NOT DETECTED
Parainfluenza Virus 4: NOT DETECTED
RHINOVIRUS / ENTEROVIRUS - RVPPCR: NOT DETECTED
Respiratory Syncytial Virus: NOT DETECTED

## 2016-07-07 LAB — BASIC METABOLIC PANEL
ANION GAP: 10 (ref 5–15)
BUN: 13 mg/dL (ref 6–20)
CALCIUM: 8.8 mg/dL — AB (ref 8.9–10.3)
CO2: 21 mmol/L — ABNORMAL LOW (ref 22–32)
CREATININE: 0.69 mg/dL (ref 0.44–1.00)
Chloride: 107 mmol/L (ref 101–111)
Glucose, Bld: 158 mg/dL — ABNORMAL HIGH (ref 65–99)
Potassium: 3.5 mmol/L (ref 3.5–5.1)
SODIUM: 138 mmol/L (ref 135–145)

## 2016-07-07 LAB — BRAIN NATRIURETIC PEPTIDE: B NATRIURETIC PEPTIDE 5: 12.4 pg/mL (ref 0.0–100.0)

## 2016-07-07 LAB — STREP PNEUMONIAE URINARY ANTIGEN: STREP PNEUMO URINARY ANTIGEN: NEGATIVE

## 2016-07-07 LAB — TROPONIN I

## 2016-07-07 LAB — PROCALCITONIN

## 2016-07-07 MED ORDER — FLUTICASONE PROPIONATE 50 MCG/ACT NA SUSP: 1.0000 | Freq: Every day | NASAL | Status: DC | PRN

## 2016-07-07 MED ORDER — METHOCARBAMOL 500 MG PO TABS: 1000.0000 mg | ORAL_TABLET | Freq: Four times a day (QID) | ORAL | Status: DC | PRN

## 2016-07-07 MED ORDER — HYDROCOD POLST-CPM POLST ER 10-8 MG/5ML PO SUER
5.0000 mL | Freq: Once | ORAL | Status: AC
  Administered 2016-07-07: 5 mL via ORAL
  Filled 2016-07-07: qty 5

## 2016-07-07 MED ORDER — PREDNISONE 50 MG PO TABS
60.0000 mg | ORAL_TABLET | Freq: Two times a day (BID) | ORAL | Status: DC
  Administered 2016-07-07: 60 mg via ORAL
  Filled 2016-07-07: qty 1

## 2016-07-07 MED ORDER — PROMETHAZINE HCL 25 MG PO TABS: 25.0000 mg | ORAL_TABLET | Freq: Four times a day (QID) | ORAL | Status: DC | PRN

## 2016-07-07 MED ORDER — ACETAMINOPHEN 325 MG PO TABS
650.0000 mg | ORAL_TABLET | Freq: Four times a day (QID) | ORAL | Status: DC | PRN
  Filled 2016-07-07: qty 2

## 2016-07-07 MED ORDER — PNEUMOCOCCAL VAC POLYVALENT 25 MCG/0.5ML IJ INJ
0.5000 mL | INJECTION | INTRAMUSCULAR | Status: DC
  Filled 2016-07-07: qty 0.5

## 2016-07-07 MED ORDER — ACETAMINOPHEN 650 MG RE SUPP: 650.0000 mg | Freq: Four times a day (QID) | RECTAL | Status: DC | PRN

## 2016-07-07 MED ORDER — DEXTROSE 5 % IV SOLN
500.0000 mg | INTRAVENOUS | Status: DC
  Administered 2016-07-08 – 2016-07-09 (×2): 500 mg via INTRAVENOUS
  Filled 2016-07-07 (×3): qty 500

## 2016-07-07 MED ORDER — OXYCODONE-ACETAMINOPHEN 5-325 MG PO TABS: 1.0000 | ORAL_TABLET | ORAL | Status: DC | PRN

## 2016-07-07 MED ORDER — TRAMADOL HCL 50 MG PO TABS
100.0000 mg | ORAL_TABLET | Freq: Four times a day (QID) | ORAL | Status: DC | PRN
  Administered 2016-07-08 – 2016-07-12 (×11): 100 mg via ORAL
  Filled 2016-07-07 (×11): qty 2

## 2016-07-07 MED ORDER — CLONAZEPAM 0.5 MG PO TABS
1.0000 mg | ORAL_TABLET | Freq: Three times a day (TID) | ORAL | Status: DC | PRN
  Administered 2016-07-07 – 2016-07-12 (×8): 1 mg via ORAL
  Filled 2016-07-07 (×8): qty 2

## 2016-07-07 MED ORDER — SODIUM CHLORIDE 0.9 % IV SOLN: 250.0000 mL | INTRAVENOUS | Status: DC | PRN

## 2016-07-07 MED ORDER — MAGNESIUM SULFATE 2 GM/50ML IV SOLN
2.0000 g | Freq: Once | INTRAVENOUS | Status: AC
  Administered 2016-07-07: 2 g via INTRAVENOUS
  Filled 2016-07-07: qty 50

## 2016-07-07 MED ORDER — MONTELUKAST SODIUM 10 MG PO TABS
10.0000 mg | ORAL_TABLET | Freq: Every day | ORAL | Status: DC
  Administered 2016-07-07 – 2016-07-11 (×5): 10 mg via ORAL
  Filled 2016-07-07 (×5): qty 1

## 2016-07-07 MED ORDER — PROBIOTIC FORMULA PO CAPS: 1.0000 | ORAL_CAPSULE | Freq: Every day | ORAL | Status: DC

## 2016-07-07 MED ORDER — IPRATROPIUM-ALBUTEROL 0.5-2.5 (3) MG/3ML IN SOLN: 3.0000 mL | RESPIRATORY_TRACT | Status: DC | PRN

## 2016-07-07 MED ORDER — SODIUM CHLORIDE 0.9% FLUSH: 3.0000 mL | INTRAVENOUS | Status: DC | PRN

## 2016-07-07 MED ORDER — IPRATROPIUM-ALBUTEROL 0.5-2.5 (3) MG/3ML IN SOLN
3.0000 mL | Freq: Four times a day (QID) | RESPIRATORY_TRACT | Status: DC
  Administered 2016-07-07 – 2016-07-09 (×6): 3 mL via RESPIRATORY_TRACT
  Filled 2016-07-07 (×7): qty 3

## 2016-07-07 MED ORDER — DEXTROSE 5 % IV SOLN
500.0000 mg | Freq: Once | INTRAVENOUS | Status: AC
  Administered 2016-07-07: 500 mg via INTRAVENOUS
  Filled 2016-07-07: qty 500

## 2016-07-07 MED ORDER — DEXTROSE 5 % IV SOLN
1.0000 g | Freq: Once | INTRAVENOUS | Status: AC
  Administered 2016-07-07: 1 g via INTRAVENOUS
  Filled 2016-07-07: qty 10

## 2016-07-07 MED ORDER — DULOXETINE HCL 60 MG PO CPEP
60.0000 mg | ORAL_CAPSULE | Freq: Two times a day (BID) | ORAL | Status: DC
  Administered 2016-07-07 – 2016-07-12 (×10): 60 mg via ORAL
  Filled 2016-07-07 (×10): qty 1

## 2016-07-07 MED ORDER — ONDANSETRON HCL 4 MG PO TABS: 4.0000 mg | ORAL_TABLET | Freq: Four times a day (QID) | ORAL | Status: DC | PRN

## 2016-07-07 MED ORDER — IPRATROPIUM-ALBUTEROL 0.5-2.5 (3) MG/3ML IN SOLN
3.0000 mL | RESPIRATORY_TRACT | Status: DC
  Administered 2016-07-07: 3 mL via RESPIRATORY_TRACT
  Filled 2016-07-07 (×2): qty 3

## 2016-07-07 MED ORDER — TRAZODONE HCL 50 MG PO TABS
100.0000 mg | ORAL_TABLET | Freq: Every day | ORAL | Status: DC
  Administered 2016-07-07 – 2016-07-11 (×5): 100 mg via ORAL
  Filled 2016-07-07 (×5): qty 2

## 2016-07-07 MED ORDER — DEXTROSE 5 % IV SOLN
1.0000 g | INTRAVENOUS | Status: DC
  Administered 2016-07-08 – 2016-07-11 (×4): 1 g via INTRAVENOUS
  Filled 2016-07-07 (×4): qty 10

## 2016-07-07 MED ORDER — ONDANSETRON HCL 4 MG/2ML IJ SOLN
4.0000 mg | Freq: Four times a day (QID) | INTRAMUSCULAR | Status: DC | PRN
  Administered 2016-07-08 – 2016-07-12 (×2): 4 mg via INTRAVENOUS
  Filled 2016-07-07 (×2): qty 2

## 2016-07-07 MED ORDER — BECLOMETHASONE DIPROPIONATE 80 MCG/ACT IN AERS
2.0000 | INHALATION_SPRAY | Freq: Two times a day (BID) | RESPIRATORY_TRACT | Status: DC
  Administered 2016-07-07 – 2016-07-12 (×10): 2 via RESPIRATORY_TRACT
  Filled 2016-07-07 (×2): qty 8.7

## 2016-07-07 MED ORDER — SODIUM CHLORIDE 0.9% FLUSH
3.0000 mL | Freq: Two times a day (BID) | INTRAVENOUS | Status: DC
  Administered 2016-07-07 – 2016-07-12 (×9): 3 mL via INTRAVENOUS

## 2016-07-07 MED ORDER — BECLOMETHASONE DIPROPIONATE 80 MCG/ACT IN AERS: 2.0000 | INHALATION_SPRAY | Freq: Two times a day (BID) | RESPIRATORY_TRACT | Status: DC

## 2016-07-07 MED ORDER — ENOXAPARIN SODIUM 40 MG/0.4ML ~~LOC~~ SOLN
40.0000 mg | SUBCUTANEOUS | Status: DC
  Administered 2016-07-07 – 2016-07-11 (×5): 40 mg via SUBCUTANEOUS
  Filled 2016-07-07 (×5): qty 0.4

## 2016-07-07 MED ORDER — LEVOTHYROXINE SODIUM 88 MCG PO TABS
88.0000 ug | ORAL_TABLET | Freq: Every day | ORAL | Status: DC
  Administered 2016-07-08 – 2016-07-12 (×5): 88 ug via ORAL
  Filled 2016-07-07 (×5): qty 1

## 2016-07-07 NOTE — Progress Notes (Signed)
Patient complaining of SOB; her sat O2=92%; Oxygen was offered @2L  via [place]. Patient verbalized she is feeling better. Will continue to monitor.

## 2016-07-07 NOTE — ED Notes (Signed)
Patient transported to X-ray 

## 2016-07-07 NOTE — ED Triage Notes (Signed)
Pt brought in by EMS from [facility]. Pt c/o SOB and chest pressure. Pt was 88% on room air and wheezing present in all fields when EMS arrived. Pt received 125mg  of solumedrol, duoneb, 324mg  of aspirin, and 400cc of NS. Pt does endorse chest pressure when she takes a deep breath and fever, pt took ibuprofen this am. Pt 93% on 2L. Pt a&ox4.

## 2016-07-07 NOTE — Progress Notes (Signed)
RT contacted pharmacy to verify giving Duoneb with the allergy warning. Pharmacy advised Duoneb would be ok.  RT monitored the pt throughout the treatment.  Pt tolerated it well.

## 2016-07-07 NOTE — Progress Notes (Signed)
Patient trasfered from ED to 5W24 via stretcher; alert and oriented x 4; no complaints of pain; IV in RH running ABx; skin intact. Orient patient to room and unit; gave patient care guide; instructed how to use the call bell and  fall risk precautions. Will continue to monitor the patient.

## 2016-07-07 NOTE — ED Provider Notes (Signed)
[facility] DEPT Provider Note   CSN: [id number] Arrival date & time: 07/07/16  [id number]     History   Chief Complaint Chief Complaint  Patient presents with  . Shortness of Breath  . Chest Pain    HPI [name] is a 56 y.o. female.  Patient is a 56 year old female sent from urgent care for evaluation of shortness of breath and cough. This started several days ago, however has worsened. She reports fever at home of 102 and feeling tight in the chest. She went to urgent care this morning, then was transferred here after being found to have low oxygen saturations. She was given a DuoNeb, steroids, and aspirin in route. She denies any prior cardiac history, but does have a history of bronchitis and asthma.   The history is provided by the patient.  Shortness of Breath  This is a new problem. The average episode lasts 3 days. The problem occurs continuously.The problem has been gradually worsening. Associated symptoms include chest pain. The treatment provided mild relief. Associated medical issues include asthma and pneumonia.  Chest Pain   Associated symptoms include shortness of breath.    Past Medical History:  Diagnosis Date  . Anxiety   . Arthritis    hips/back  . Asthma   . Complication of anesthesia 2000   aspirated- had been sick prior to surgery- asthma  . Depression   . Headache(784.0)   . Heart murmur   . Hx of abnormal Pap smear 2006   no treatment to cervix  . Hypertension   . Hypothyroidism   . Panic attack 03/04/13  . Pneumonia   . Ruptured cervical disc   . Seasonal allergies   . Shortness of breath   . Thyroid disease    hypothyroidism    Patient Active Problem List   Diagnosis Date Noted  . S/P cervical spinal fusion 03/03/2014    Past Surgical History:  Procedure Laterality Date  . ABDOMINAL HYSTERECTOMY  02/2010   Robotic-[facility]--Dr. Joan Flores  . ANTERIOR AND POSTERIOR REPAIR N/A 04/05/2013   Procedure: ANTERIOR (CYSTOCELE) AND  POSTERIOR REPAIR (RECTOCELE);  Surgeon: Arloa Koh, MD;  Location: [facility];  Service: Gynecology;  Laterality: N/A;  . ANTERIOR CERVICAL DECOMP/DISCECTOMY FUSION N/A 03/03/2014   Procedure: ANTERIOR CERVICAL DECOMPRESSION/DISCECTOMY FUSION 2 LEVELS four/five, five/six;  Surgeon: Eustace Moore, MD;  Location: [facility];  Service: Neurosurgery;  Laterality: N/A;  . BLADDER SUSPENSION N/A 04/05/2013   Procedure: TRANSVAGINAL TAPE (TVT) PROCEDURE WITH CYSTO;  Surgeon: Arloa Koh, MD;  Location: [facility];  Service: Gynecology;  Laterality: N/A;  site- vagina and urethra  . ENDOMETRIAL BIOPSY  10-10-09   Benign fragments of endometrial polyp  . INGUINAL HERNIA REPAIR     as an infant  . LIPOSUCTION  2006   thighs, chin    OB History    Gravida Para Term Preterm AB Living   6 2 2  0 4 2   SAB TAB Ectopic Multiple Live Births   4               Home Medications    Prior to Admission medications   Medication Sig Start Date End Date Taking? Authorizing Provider  ADVIL 200 MG tablet Take 200 mg by mouth 2 (two) times daily. 05/17/13   Historical Provider, MD  albuterol (PROVENTIL HFA;VENTOLIN HFA) 108 (90 BASE) MCG/ACT inhaler Inhale 2 puffs into the lungs daily as needed for wheezing or shortness of breath (rescue inhaler).  Historical Provider, MD  bisoprolol (ZEBETA) 5 MG tablet Take 2.5 mg by mouth daily.     Historical Provider, MD  cetirizine (ZYRTEC) 10 MG tablet Take 10 mg by mouth daily.      Historical Provider, MD  clonazePAM (KLONOPIN) 1 MG tablet Take 1 mg by mouth 3 (three) times daily as needed for anxiety.    Historical Provider, MD  diphenhydrAMINE (BENADRYL) 25 MG tablet Take 25 mg by mouth every 6 (six) hours as needed for allergies.    Historical Provider, MD  DULoxetine (CYMBALTA) 60 MG capsule Take 60 mg by mouth 2 (two) times daily.     Historical Provider, MD  EPIPEN 2-PAK 0.3 MG/0.3ML SOAJ injection Inject 0.3 mg into the muscle as needed. 02/01/13   Historical  Provider, MD  fluticasone (FLONASE) 50 MCG/ACT nasal spray Place 1 spray into both nostrils daily as needed for allergies or rhinitis.    Historical Provider, MD  levothyroxine (SYNTHROID, LEVOTHROID) 88 MCG tablet Take 88 mcg by mouth daily before breakfast.    Historical Provider, MD  methocarbamol (ROBAXIN) 500 MG tablet Take 2 tablets (1,000 mg total) by mouth 4 (four) times daily as needed (Pain). 02/07/15   Nicole Pisciotta, PA-C  mometasone (NASONEX) 50 MCG/ACT nasal spray Place 2 sprays into the nose 2 (two) times daily.     Historical Provider, MD  montelukast (SINGULAIR) 10 MG tablet Take 10 mg by mouth at bedtime.    Historical Provider, MD  Nutritional Supplements (ESTROVEN PO) Take 1 tablet by mouth daily.    Historical Provider, MD  oxyCODONE-acetaminophen (PERCOCET/ROXICET) 5-325 MG per tablet Take 1-2 tablets by mouth every 4 (four) hours as needed for moderate pain. 03/04/14   Kristeen Miss, MD  polyethylene glycol [facility] / GLYCOLAX) packet Take 17 g by mouth daily as needed for mild constipation.     Historical Provider, MD  Probiotic Product (PROBIOTIC FORMULA) CAPS Take 1 capsule by mouth daily.      Historical Provider, MD  promethazine (PHENERGAN) 25 MG tablet Take 1 tablet by mouth every 6 (six) hours as needed for nausea.  03/17/13   Historical Provider, MD  QVAR 80 MCG/ACT inhaler Inhale 2 puffs into the lungs 2 (two) times daily.  01/04/13   Historical Provider, MD  SOLODYN 80 MG TB24 Take 1 tablet by mouth daily.  12/03/14   Historical Provider, MD  traMADol (ULTRAM) 50 MG tablet Take 100 mg by mouth every 6 (six) hours as needed for moderate pain or severe pain.  04/02/13   Historical Provider, MD  traZODone (DESYREL) 50 MG tablet Take 100 mg by mouth at bedtime.     Historical Provider, MD    Family History Family History  Problem Relation Age of Onset  . Brain cancer Mother     dec with brain & lung CA  . Lung cancer Mother   . Bladder Cancer Father     dec age 83    . Prostate cancer Father   . Diabetes Maternal Grandmother   . Diabetes Maternal Grandfather   . Diabetes Paternal Grandmother   . Diabetes Paternal Grandfather     Social History Social History  Substance Use Topics  . Smoking status: Never Smoker  . Smokeless tobacco: Never Used  . Alcohol use 1.5 oz/week    3 Standard drinks or equivalent per week     Comment: 3 glasses of wine per week     Allergies   Bactrim [sulfamethoxazole-trimethoprim]; Other; and Ciprofloxacin  Review of Systems Review of Systems  Respiratory: Positive for shortness of breath.   Cardiovascular: Positive for chest pain.  All other systems reviewed and are negative.    Physical Exam Updated Vital Signs BP 113/71 (BP Location: Left Arm)   Pulse 101   Temp 98.1 F (36.7 C) (Oral)   Resp 22   LMP 02/26/2010   SpO2 93%   Physical Exam  Constitutional: She is oriented to person, place, and time. She appears well-developed and well-nourished. No distress.  HENT:  Head: Normocephalic and atraumatic.  Mouth/Throat: Oropharynx is clear and moist.  Neck: Normal range of motion. Neck supple.  Cardiovascular: Normal rate and regular rhythm.  Exam reveals no gallop and no friction rub.   No murmur heard. Pulmonary/Chest: Effort normal. No respiratory distress. She has no wheezes. She has rales.  There are slight rales in the right base.  Abdominal: Soft. Bowel sounds are normal. She exhibits no distension. There is no tenderness.  Musculoskeletal: Normal range of motion. She exhibits no edema.  Neurological: She is alert and oriented to person, place, and time.  Skin: Skin is warm and dry. She is not diaphoretic.  Nursing note and vitals reviewed.    ED Treatments / Results  Labs (all labs ordered are listed, but only abnormal results are displayed) Labs Reviewed  BASIC METABOLIC PANEL  CBC WITH DIFFERENTIAL/PLATELET  BRAIN NATRIURETIC PEPTIDE  TROPONIN I    EKG  EKG  Interpretation None       Radiology No results found.  Procedures Procedures (including critical care time)  Medications Ordered in ED Medications - No data to display   Initial Impression / Assessment and Plan / ED Course  I have reviewed the triage vital signs and the nursing notes.  Pertinent labs & imaging results that were available during my care of the patient were reviewed by me and considered in my medical decision making (see chart for details).  Patient workup reveals what appears to be a right middle lobe pneumonia. She remains hypoxic despite steroids and breathing treatments. She was given Rocephin and Zithromax and will be admitted to the hospitalist service.  Final Clinical Impressions(s) / ED Diagnoses   Final diagnoses:  None    New Prescriptions New Prescriptions   No medications on file     Veryl Speak, MD 07/08/16 [phone]

## 2016-07-07 NOTE — ED Notes (Signed)
EKG given to Dr. Delo 

## 2016-07-07 NOTE — ED Notes (Signed)
Pt given water per Whitney(RN) 

## 2016-07-07 NOTE — H&P (Signed)
History and Physical    [name] [id number] DOB: March 29, 1961 DOA: 07/07/2016  PCP: Abigail Miyamoto, MD Patient coming from: [facility]  Chief Complaint: SOB and Wheezing  HPI: [name] is a 56 y.o. female with medical history significant of anxiety, asthma, depression, headache, hypertension, hypothyroidism.     Patient complaining of shortness of breath cough and wheezing. Started several days ago. Getting worse. Associated with fevers as high as 102 with a tight sensation in chest. posttussive emesis. Denies any palpitations, true chest pain, nausea, belly pain, dysuria, frequency, neck stiffness, headache, LOC. Home inhalers with minimal improvement. She seen at [facility] urgent care on day of admission and found to be hypoxic (86% at rest). Patient given Solu-Medrol 125 mg, DuoNeb and ASA 324 mg prior to arrival.   ED Course: CTX and Axithro started in ED  Review of Systems: As per HPI otherwise 10 point review of systems negative.   Ambulatory Status: no restrictions  Past Medical History:  Diagnosis Date  . Anxiety   . Arthritis    hips/back  . Asthma   . Complication of anesthesia 2000   aspirated- had been sick prior to surgery- asthma  . Depression   . Headache(784.0)   . Heart murmur   . Hx of abnormal Pap smear 2006   no treatment to cervix  . Hypertension   . Hypothyroidism   . Panic attack 03/04/13  . Pneumonia   . Ruptured cervical disc   . Seasonal allergies   . Shortness of breath   . Thyroid disease    hypothyroidism    Past Surgical History:  Procedure Laterality Date  . ABDOMINAL HYSTERECTOMY  02/2010   Robotic-[facility]--Dr. Joan Flores  . ANTERIOR AND POSTERIOR REPAIR N/A 04/05/2013   Procedure: ANTERIOR (CYSTOCELE) AND POSTERIOR REPAIR (RECTOCELE);  Surgeon: Arloa Koh, MD;  Location: [facility];  Service: Gynecology;  Laterality: N/A;  . ANTERIOR CERVICAL DECOMP/DISCECTOMY FUSION N/A 03/03/2014   Procedure: ANTERIOR CERVICAL  DECOMPRESSION/DISCECTOMY FUSION 2 LEVELS four/five, five/six;  Surgeon: Eustace Moore, MD;  Location: [facility];  Service: Neurosurgery;  Laterality: N/A;  . BLADDER SUSPENSION N/A 04/05/2013   Procedure: TRANSVAGINAL TAPE (TVT) PROCEDURE WITH CYSTO;  Surgeon: Arloa Koh, MD;  Location: [facility];  Service: Gynecology;  Laterality: N/A;  site- vagina and urethra  . ENDOMETRIAL BIOPSY  10-10-09   Benign fragments of endometrial polyp  . INGUINAL HERNIA REPAIR     as an infant  . LIPOSUCTION  2006   thighs, chin    Social History   Social History  . Marital status: Married    Spouse name: N/A  . Number of children: N/A  . Years of education: N/A   Occupational History  . Not on file.   Social History Main Topics  . Smoking status: Never Smoker  . Smokeless tobacco: Never Used  . Alcohol use 1.5 oz/week    3 Standard drinks or equivalent per week     Comment: 3 glasses of wine per week  . Drug use: No  . Sexual activity: Yes    Partners: Male    Birth control/ protection: Surgical     Comment: R-[facility]   Other Topics Concern  . Not on file   Social History Narrative  . No narrative on file    Allergies  Allergen Reactions  . Bactrim [Sulfamethoxazole-Trimethoprim] Anaphylaxis  . Other Anaphylaxis    All nuts   . Ciprofloxacin     SOB &  blisters on throat    Family History  Problem Relation Age of Onset  . Brain cancer Mother     dec with brain & lung CA  . Lung cancer Mother   . Bladder Cancer Father     dec age 53  . Prostate cancer Father   . Diabetes Maternal Grandmother   . Diabetes Maternal Grandfather   . Diabetes Paternal Grandmother   . Diabetes Paternal Grandfather     Prior to Admission medications   Medication Sig Start Date End Date Taking? Authorizing Provider  ADVIL 200 MG tablet Take 200 mg by mouth 2 (two) times daily. 05/17/13   Historical Provider, MD  albuterol (PROVENTIL HFA;VENTOLIN HFA) 108 (90 BASE) MCG/ACT inhaler Inhale 2 puffs  into the lungs daily as needed for wheezing or shortness of breath (rescue inhaler).    Historical Provider, MD  bisoprolol (ZEBETA) 5 MG tablet Take 2.5 mg by mouth daily.     Historical Provider, MD  cetirizine (ZYRTEC) 10 MG tablet Take 10 mg by mouth daily.      Historical Provider, MD  clonazePAM (KLONOPIN) 1 MG tablet Take 1 mg by mouth 3 (three) times daily as needed for anxiety.    Historical Provider, MD  diphenhydrAMINE (BENADRYL) 25 MG tablet Take 25 mg by mouth every 6 (six) hours as needed for allergies.    Historical Provider, MD  DULoxetine (CYMBALTA) 60 MG capsule Take 60 mg by mouth 2 (two) times daily.     Historical Provider, MD  EPIPEN 2-PAK 0.3 MG/0.3ML SOAJ injection Inject 0.3 mg into the muscle as needed. 02/01/13   Historical Provider, MD  fluticasone (FLONASE) 50 MCG/ACT nasal spray Place 1 spray into both nostrils daily as needed for allergies or rhinitis.    Historical Provider, MD  levothyroxine (SYNTHROID, LEVOTHROID) 88 MCG tablet Take 88 mcg by mouth daily before breakfast.    Historical Provider, MD  methocarbamol (ROBAXIN) 500 MG tablet Take 2 tablets (1,000 mg total) by mouth 4 (four) times daily as needed (Pain). 02/07/15   Nicole Pisciotta, PA-C  mometasone (NASONEX) 50 MCG/ACT nasal spray Place 2 sprays into the nose 2 (two) times daily.     Historical Provider, MD  montelukast (SINGULAIR) 10 MG tablet Take 10 mg by mouth at bedtime.    Historical Provider, MD  Nutritional Supplements (ESTROVEN PO) Take 1 tablet by mouth daily.    Historical Provider, MD  oxyCODONE-acetaminophen (PERCOCET/ROXICET) 5-325 MG per tablet Take 1-2 tablets by mouth every 4 (four) hours as needed for moderate pain. 03/04/14   Kristeen Miss, MD  polyethylene glycol [facility] / GLYCOLAX) packet Take 17 g by mouth daily as needed for mild constipation.     Historical Provider, MD  Probiotic Product (PROBIOTIC FORMULA) CAPS Take 1 capsule by mouth daily.      Historical Provider, MD    promethazine (PHENERGAN) 25 MG tablet Take 1 tablet by mouth every 6 (six) hours as needed for nausea.  03/17/13   Historical Provider, MD  QVAR 80 MCG/ACT inhaler Inhale 2 puffs into the lungs 2 (two) times daily.  01/04/13   Historical Provider, MD  SOLODYN 80 MG TB24 Take 1 tablet by mouth daily.  12/03/14   Historical Provider, MD  traMADol (ULTRAM) 50 MG tablet Take 100 mg by mouth every 6 (six) hours as needed for moderate pain or severe pain.  04/02/13   Historical Provider, MD  traZODone (DESYREL) 50 MG tablet Take 100 mg by mouth at bedtime.  Historical Provider, MD    Physical Exam: Vitals:   07/07/16 0956 07/07/16 1004 07/07/16 1209  BP:  113/71 133/75  Pulse:  101 91  Resp:  22 14  Temp:  98.1 F (36.7 C) 98.1 F (36.7 C)  TempSrc:  Oral Oral  SpO2: 93% 93% 92%     General: appears mildly distressed and sitting up in bed.  Eyes:  PERRL, EOMI, normal lids, iris ENT:  grossly normal hearing, lips & tongue, mmm Neck:  no LAD, masses or thyromegaly Cardiovascular:  RRR, no m/r/g. No LE edema.  Respiratory:  Crackles with diminished breath sounds in the right. Wheezing throughout. Increased effort. On 2 L nasal cannula.. Abdomen:  soft, ntnd, NABS Skin:  no rash or induration seen on limited exam Musculoskeletal:  grossly normal tone BUE/BLE, good ROM, no bony abnormality Psychiatric:  grossly normal mood and affect, speech fluent and appropriate, AOx3 Neurologic:  CN 2-12 grossly intact, moves all extremities in coordinated fashion, sensation intact  Labs on Admission: I have personally reviewed following labs and imaging studies  CBC:  Recent Labs Lab 07/07/16 1012  WBC 7.0  NEUTROABS 4.7  HGB 14.0  HCT 43.2  MCV 87.1  PLT [id number]*   Basic Metabolic Panel:  Recent Labs Lab 07/07/16 1012  NA 138  K 3.5  CL 107  CO2 21*  GLUCOSE 158*  BUN 13  CREATININE 0.69  CALCIUM 8.8*   GFR: CrCl cannot be calculated (Unknown ideal weight.). Liver Function  Tests: No results for input(s): AST, ALT, ALKPHOS, BILITOT, PROT, ALBUMIN in the last 168 hours. No results for input(s): LIPASE, AMYLASE in the last 168 hours. No results for input(s): AMMONIA in the last 168 hours. Coagulation Profile: No results for input(s): INR, PROTIME in the last 168 hours. Cardiac Enzymes:  Recent Labs Lab 07/07/16 1012  TROPONINI <0.03   BNP (last 3 results) No results for input(s): PROBNP in the last 8760 hours. HbA1C: No results for input(s): HGBA1C in the last 72 hours. CBG: No results for input(s): GLUCAP in the last 168 hours. Lipid Profile: No results for input(s): CHOL, HDL, LDLCALC, TRIG, CHOLHDL, LDLDIRECT in the last 72 hours. Thyroid Function Tests: No results for input(s): TSH, T4TOTAL, FREET4, T3FREE, THYROIDAB in the last 72 hours. Anemia Panel: No results for input(s): VITAMINB12, FOLATE, FERRITIN, TIBC, IRON, RETICCTPCT in the last 72 hours. Urine analysis:    Component Value Date/Time   BILIRUBINUR n 02/13/2014 1351   PROTEINUR n 02/13/2014 1351   UROBILINOGEN negative 02/13/2014 1351   NITRITE n 02/13/2014 1351   LEUKOCYTESUR Negative 02/13/2014 1351    Creatinine Clearance: CrCl cannot be calculated (Unknown ideal weight.).  Sepsis Labs: @LABRCNTIP (procalcitonin:4,lacticidven:4) )No results found for this or any previous visit (from the past 240 hour(s)).   Radiological Exams on Admission: Dg Chest 2 View  Result Date: 07/07/2016 CLINICAL DATA:  Fever, cough for 2 weeks EXAM: CHEST  2 VIEW COMPARISON:  03/01/2014 FINDINGS: Bibasilar airspace opacities, right greater than left could reflect atelectasis or infiltrates. No effusions. Heart is normal size. No acute bony abnormality. IMPRESSION: Bibasilar atelectasis or infiltrates, right worse than left. Electronically Signed   By: Rolm Baptise M.D.   On: 07/07/2016 10:37    EKG: Independently reviewed. Sinus. No ACS, tachy  Assessment/Plan Active Problems:   Lobar pneumonia  (HCC)   Acute respiratory failure (HCC)   Asthma exacerbation   Chronic pain   Hypothyroidism   Depression with anxiety   Acute respiratory failure: Hypoxemic with  O2 saturations of 86% at rest on RA. Likely secondary to combination of asthma exacerbation secondary to CAP. - Continue ceftriaxone and azithromycin - Pro calcitonin, respiratory viral panel - Continue Singulair, Qvar - DuoNeb every 4 - Prednisone 60 mg every 12 hours - Pneumonia order set - Wean O2 as able  Chest tightness: suspect from respiraotry pathology. No h/o cardiac disease. EKG unremarkable. Resolved  w/ nebulizer treatments and steroids.  - Trop x1  Chronic pain: - continue oxycodone, robaxin  Depression/Anxiety/Insomnia: - continue klonopin, Cymbalta, Trazodone  Hypothyroidism: - continue synthroid    DVT prophylaxis: Lovenox  Code Status: full  Family Communication: friend  Disposition Plan: pending improvement   Consults called: none  Admission status: observation    Linnea Todisco J MD [facility]  If 7PM-7AM, please contact night-coverage [email or web address] Password [facility]  07/07/2016, 12:43 PM

## 2016-07-07 NOTE — ED Notes (Signed)
Attempted report x1. 

## 2016-07-08 DIAGNOSIS — F418 Other specified anxiety disorders: Secondary | ICD-10-CM | POA: Diagnosis not present

## 2016-07-08 DIAGNOSIS — I10 Essential (primary) hypertension: Secondary | ICD-10-CM | POA: Diagnosis present

## 2016-07-08 DIAGNOSIS — J9601 Acute respiratory failure with hypoxia: Secondary | ICD-10-CM | POA: Diagnosis present

## 2016-07-08 DIAGNOSIS — F419 Anxiety disorder, unspecified: Secondary | ICD-10-CM | POA: Diagnosis present

## 2016-07-08 DIAGNOSIS — Z79899 Other long term (current) drug therapy: Secondary | ICD-10-CM | POA: Diagnosis not present

## 2016-07-08 DIAGNOSIS — F329 Major depressive disorder, single episode, unspecified: Secondary | ICD-10-CM | POA: Diagnosis present

## 2016-07-08 DIAGNOSIS — G8929 Other chronic pain: Secondary | ICD-10-CM | POA: Diagnosis present

## 2016-07-08 DIAGNOSIS — J4531 Mild persistent asthma with (acute) exacerbation: Secondary | ICD-10-CM | POA: Diagnosis not present

## 2016-07-08 DIAGNOSIS — J181 Lobar pneumonia, unspecified organism: Secondary | ICD-10-CM | POA: Diagnosis present

## 2016-07-08 DIAGNOSIS — R0602 Shortness of breath: Secondary | ICD-10-CM | POA: Diagnosis present

## 2016-07-08 DIAGNOSIS — E039 Hypothyroidism, unspecified: Secondary | ICD-10-CM | POA: Diagnosis present

## 2016-07-08 DIAGNOSIS — Z91018 Allergy to other foods: Secondary | ICD-10-CM | POA: Diagnosis not present

## 2016-07-08 DIAGNOSIS — K5900 Constipation, unspecified: Secondary | ICD-10-CM | POA: Diagnosis present

## 2016-07-08 DIAGNOSIS — Z888 Allergy status to other drugs, medicaments and biological substances status: Secondary | ICD-10-CM | POA: Diagnosis not present

## 2016-07-08 DIAGNOSIS — Z91048 Other nonmedicinal substance allergy status: Secondary | ICD-10-CM | POA: Diagnosis not present

## 2016-07-08 DIAGNOSIS — Z981 Arthrodesis status: Secondary | ICD-10-CM | POA: Diagnosis not present

## 2016-07-08 DIAGNOSIS — G4700 Insomnia, unspecified: Secondary | ICD-10-CM | POA: Diagnosis present

## 2016-07-08 DIAGNOSIS — J45901 Unspecified asthma with (acute) exacerbation: Secondary | ICD-10-CM | POA: Diagnosis present

## 2016-07-08 LAB — CBC
HCT: 40.7 % (ref 36.0–46.0)
Hemoglobin: 13.3 g/dL (ref 12.0–15.0)
MCH: 28.2 pg (ref 26.0–34.0)
MCHC: 32.7 g/dL (ref 30.0–36.0)
MCV: 86.4 fL (ref 78.0–100.0)
PLATELETS: 171 10*3/uL (ref 150–400)
RBC: 4.71 MIL/uL (ref 3.87–5.11)
RDW: 13.8 % (ref 11.5–15.5)
WBC: 7.2 10*3/uL (ref 4.0–10.5)

## 2016-07-08 LAB — BASIC METABOLIC PANEL
Anion gap: 10 (ref 5–15)
BUN: 9 mg/dL (ref 6–20)
CO2: 23 mmol/L (ref 22–32)
CREATININE: 0.54 mg/dL (ref 0.44–1.00)
Calcium: 9.6 mg/dL (ref 8.9–10.3)
Chloride: 106 mmol/L (ref 101–111)
GFR calc Af Amer: >60 mL/min (ref >60)
Glucose, Bld: 164 mg/dL — ABNORMAL HIGH (ref 65–99)
POTASSIUM: 3.9 mmol/L (ref 3.5–5.1)
Sodium: 139 mmol/L (ref 135–145)

## 2016-07-08 LAB — HIV ANTIBODY (ROUTINE TESTING W REFLEX): HIV SCREEN 4TH GENERATION: NONREACTIVE

## 2016-07-08 LAB — LEGIONELLA PNEUMOPHILA SEROGP 1 UR AG: L. PNEUMOPHILA SEROGP 1 UR AG: NEGATIVE

## 2016-07-08 MED ORDER — POLYETHYLENE GLYCOL 3350 17 G PO PACK
17.0000 g | PACK | Freq: Every day | ORAL | Status: DC
  Administered 2016-07-08 – 2016-07-12 (×5): 17 g via ORAL
  Filled 2016-07-08 (×5): qty 1

## 2016-07-08 MED ORDER — HYDROCODONE-HOMATROPINE 5-1.5 MG/5ML PO SYRP
5.0000 mL | ORAL_SOLUTION | Freq: Four times a day (QID) | ORAL | Status: DC | PRN
  Administered 2016-07-08 – 2016-07-11 (×5): 5 mL via ORAL
  Filled 2016-07-08 (×5): qty 5

## 2016-07-08 MED ORDER — PREDNISONE 50 MG PO TABS
60.0000 mg | ORAL_TABLET | Freq: Every day | ORAL | Status: DC
  Administered 2016-07-09: 60 mg via ORAL
  Filled 2016-07-08: qty 1

## 2016-07-08 MED ORDER — HYDROCOD POLST-CPM POLST ER 10-8 MG/5ML PO SUER: 5.0000 mL | Freq: Two times a day (BID) | ORAL | Status: DC | PRN

## 2016-07-08 MED ORDER — BENZONATATE 100 MG PO CAPS
200.0000 mg | ORAL_CAPSULE | Freq: Three times a day (TID) | ORAL | Status: DC
  Administered 2016-07-08 – 2016-07-12 (×13): 200 mg via ORAL
  Filled 2016-07-08 (×13): qty 2

## 2016-07-08 NOTE — Progress Notes (Signed)
PROGRESS NOTE    [name]  [id number] DOB: [birth year] DOA: 07/07/2016 PCP: Beatris Si   Chief Complaint  Patient presents with  . Shortness of Breath  . Chest Pain    Brief Narrative:  56 year old female history of anxiety, asthma, depression, hypertension, presented to the emergency department with complaints of shortness of breath cough and wheezing which started several days ago. She also had fevers at home. Currently complaining of cough and continued shortness of breath. Admitted for acute respiratory failure and asthma exacerbation. Assessment & Plan   Acute hypoxic respiratory failure/asthma exacerbation and possible community-acquired pneumonia -SPO2 was 86% on room air upon admission -Chest x-ray: Bibasilar atelectasis or infiltrates, right worse than left -Placed on azithromycin and ceftriaxone -Respiratory viral panel positive for metapneumovirus, influenza negative -Strep pneumoniae urine antigen negative -Continue supplemental oxygen to maintain saturations above 92%. Will attempt to wean today if possible. -Patient feels steroids makes her jittery. Will place her on prednisone 60 mg daily, and possibly taper quickly. -Continue nebulizer treatments, Singulair, Qvar -Given continued cough, will add on Tessalon Perles scheduled as well as Hycodan as needed  Chest tightness -Denies further chest pain. However suspect due to respiratory pathology -EKG unremarkable -Troponin negative thus far  Chronic pain -Continue oxycodone, Robaxin  Hypothyroidism -Continue Synthroid  Depression, anxiety, insomnia -Continue Klonopin, Cymbalta, trazodone  Constipation -MiraLAX ordered  DVT Prophylaxis  Lovenox  Code Status: Full  Family Communication: None at bedside  Disposition Plan: Observation. Possible discharge to home in 24-48 hours pending improvement in respiratory status  Consultants None  Procedures  None  Antibiotics     Anti-infectives    Start     Dose/Rate Route Frequency Ordered Stop   07/08/16 1000  cefTRIAXone (ROCEPHIN) 1 g in dextrose 5 % 50 mL IVPB     1 g 100 mL/hr over 30 Minutes Intravenous Every 24 hours 07/07/16 1241 07/14/16 0959   07/08/16 1000  azithromycin (ZITHROMAX) 500 mg in dextrose 5 % 250 mL IVPB     500 mg 250 mL/hr over 60 Minutes Intravenous Every 24 hours 07/07/16 1241 07/14/16 0959   07/07/16 1130  cefTRIAXone (ROCEPHIN) 1 g in dextrose 5 % 50 mL IVPB     1 g 100 mL/hr over 30 Minutes Intravenous  Once 07/07/16 1119 07/07/16 1216   07/07/16 1130  azithromycin (ZITHROMAX) 500 mg in dextrose 5 % 250 mL IVPB     500 mg 250 mL/hr over 60 Minutes Intravenous  Once 07/07/16 1119 07/07/16 1345      Subjective:   [name] seen and examined today.  Patient continues to feel short of breath and have cough, mildly productive. Feels that receiving steroids makes her jittery and unable to sleep. Currently denies further chest pain, abdominal pain, nausea, vomiting. Does complain of constipation.  Objective:   Vitals:   07/07/16 2246 07/08/16 0520 07/08/16 0919 07/08/16 1204  BP: 122/75 126/82    Pulse: 98 77    Resp: 18 18    Temp: 97.6 F (36.4 C) 97.8 F (36.6 C)    TempSrc: Oral     SpO2: 92% 94% 96% 94%  Weight:    100.2 kg (221 lb)  Height:    5\' 11"  (1.803 m)    Intake/Output Summary (Last 24 hours) at 07/08/16 1252 Last data filed at 07/08/16 1021  Gross per 24 hour  Intake              480 ml  Output  1800 ml  Net            -1320 ml   Filed Weights   07/08/16 1204  Weight: 100.2 kg (221 lb)    Exam  General: Well developed, well nourished, NAD, appears stated age  HEENT: NCAT, mucous membranes moist.   Cardiovascular: S1 S2 auscultated, no rubs, murmurs or gallops. Regular rate and rhythm.  Respiratory: Diminished breath sounds. Diffuse expiratory wheezing. Crackles noted on the right.   Abdomen: Soft, nontender,  nondistended, + bowel sounds  Extremities: warm dry without cyanosis clubbing or edema  Neuro: AAOx3, nonfocal   Skin: Without rashes exudates or nodules  Psych: Normal affect and demeanor with intact judgement and insight   Data Reviewed: I have personally reviewed following labs and imaging studies  CBC:  Recent Labs Lab 07/07/16 1012 07/08/16 0647  WBC 7.0 7.2  NEUTROABS 4.7  --   HGB 14.0 13.3  HCT 43.2 40.7  MCV 87.1 86.4  PLT 139* [id number]   Basic Metabolic Panel:  Recent Labs Lab 07/07/16 1012 07/08/16 0647  NA 138 139  K 3.5 3.9  CL 107 106  CO2 21* 23  GLUCOSE 158* 164*  BUN 13 9  CREATININE 0.69 0.54  CALCIUM 8.8* 9.6   GFR: Estimated Creatinine Clearance: 106 mL/min (by C-G formula based on SCr of 0.54 mg/dL). Liver Function Tests: No results for input(s): AST, ALT, ALKPHOS, BILITOT, PROT, ALBUMIN in the last 168 hours. No results for input(s): LIPASE, AMYLASE in the last 168 hours. No results for input(s): AMMONIA in the last 168 hours. Coagulation Profile: No results for input(s): INR, PROTIME in the last 168 hours. Cardiac Enzymes:  Recent Labs Lab 07/07/16 1012 07/07/16 1322  TROPONINI <0.03 <0.03   BNP (last 3 results) No results for input(s): PROBNP in the last 8760 hours. HbA1C: No results for input(s): HGBA1C in the last 72 hours. CBG: No results for input(s): GLUCAP in the last 168 hours. Lipid Profile: No results for input(s): CHOL, HDL, LDLCALC, TRIG, CHOLHDL, LDLDIRECT in the last 72 hours. Thyroid Function Tests: No results for input(s): TSH, T4TOTAL, FREET4, T3FREE, THYROIDAB in the last 72 hours. Anemia Panel: No results for input(s): VITAMINB12, FOLATE, FERRITIN, TIBC, IRON, RETICCTPCT in the last 72 hours. Urine analysis:    Component Value Date/Time   BILIRUBINUR n 02/13/2014 1351   PROTEINUR n 02/13/2014 1351   UROBILINOGEN negative 02/13/2014 1351   NITRITE n 02/13/2014 1351   LEUKOCYTESUR Negative 02/13/2014 1351    Sepsis Labs: @LABRCNTIP (procalcitonin:4,lacticidven:4)  ) Recent Results (from the past 240 hour(s))  Respiratory Panel by PCR     Status: Abnormal   Collection Time: 07/07/16 12:39 PM  Result Value Ref Range Status   Adenovirus NOT DETECTED NOT DETECTED Final   Coronavirus 229E NOT DETECTED NOT DETECTED Final   Coronavirus HKU1 NOT DETECTED NOT DETECTED Final   Coronavirus NL63 NOT DETECTED NOT DETECTED Final   Coronavirus OC43 NOT DETECTED NOT DETECTED Final   Metapneumovirus DETECTED (A) NOT DETECTED Final   Rhinovirus / Enterovirus NOT DETECTED NOT DETECTED Final   Influenza A NOT DETECTED NOT DETECTED Final   Influenza B NOT DETECTED NOT DETECTED Final   Parainfluenza Virus 1 NOT DETECTED NOT DETECTED Final   Parainfluenza Virus 2 NOT DETECTED NOT DETECTED Final   Parainfluenza Virus 3 NOT DETECTED NOT DETECTED Final   Parainfluenza Virus 4 NOT DETECTED NOT DETECTED Final   Respiratory Syncytial Virus NOT DETECTED NOT DETECTED Final   Bordetella pertussis NOT DETECTED  NOT DETECTED Final   Chlamydophila pneumoniae NOT DETECTED NOT DETECTED Final   Mycoplasma pneumoniae NOT DETECTED NOT DETECTED Final  Culture, blood (routine x 2) Call MD if unable to obtain prior to antibiotics being given     Status: None (Preliminary result)   Collection Time: 07/07/16  5:05 PM  Result Value Ref Range Status   Specimen Description BLOOD LEFT ANTECUBITAL  Final   Special Requests BOTTLES DRAWN AEROBIC AND ANAEROBIC 5CC  Final   Culture NO GROWTH < 24 HOURS  Final   Report Status PENDING  Incomplete  Culture, blood (routine x 2) Call MD if unable to obtain prior to antibiotics being given     Status: None (Preliminary result)   Collection Time: 07/07/16  5:15 PM  Result Value Ref Range Status   Specimen Description BLOOD LEFT HAND  Final   Special Requests BOTTLES DRAWN AEROBIC AND ANAEROBIC 5CC  Final   Culture NO GROWTH < 24 HOURS  Final   Report Status PENDING  Incomplete       Radiology Studies: Dg Chest 2 View  Result Date: 07/07/2016 CLINICAL DATA:  Fever, cough for 2 weeks EXAM: CHEST  2 VIEW COMPARISON:  03/01/2014 FINDINGS: Bibasilar airspace opacities, right greater than left could reflect atelectasis or infiltrates. No effusions. Heart is normal size. No acute bony abnormality. IMPRESSION: Bibasilar atelectasis or infiltrates, right worse than left. Electronically Signed   By: Rolm Baptise M.D.   On: 07/07/2016 10:37     Scheduled Meds: . azithromycin  500 mg Intravenous Q24H  . beclomethasone  2 puff Inhalation BID  . benzonatate  200 mg Oral TID  . cefTRIAXone (ROCEPHIN)  IV  1 g Intravenous Q24H  . DULoxetine  60 mg Oral BID  . enoxaparin (LOVENOX) injection  40 mg Subcutaneous Q24H  . ipratropium-albuterol  3 mL Nebulization QID  . levothyroxine  88 mcg Oral QAC breakfast  . montelukast  10 mg Oral QHS  . pneumococcal 23 valent vaccine  0.5 mL Intramuscular Tomorrow-1000  . polyethylene glycol  17 g Oral Daily  . [START ON 07/09/2016] predniSONE  60 mg Oral Q breakfast  . sodium chloride flush  3 mL Intravenous Q12H  . traZODone  100 mg Oral QHS   Continuous Infusions:   LOS: 0 days   Time Spent in minutes   30 minutes  Brena Windsor D.O. on 07/08/2016 at 12:52 PM  Between 7am to 7pm - Pager - [phone]  After 7pm go to [email or web address] - password [email or web address]  And look for the night coverage person covering for me after hours  [facility] Office  [phone]

## 2016-07-08 NOTE — Progress Notes (Signed)
Patient asking for a stool softener and patient states last bowel movement was one week ago.

## 2016-07-08 NOTE — Progress Notes (Addendum)
SATURATION QUALIFICATIONS: (This note is used to comply with regulatory documentation for home oxygen)  Patient Saturations on Room Air at Rest = 97%  Patient Saturations on Room Air while Ambulating =94%  Patient wears oxygen for comfort only.

## 2016-07-08 NOTE — Plan of Care (Signed)
Problem: Education: Goal: Knowledge of [facility] [facility] Education information/materials will improve Outcome: Progressing POC reviewed with pt.   

## 2016-07-08 NOTE — Progress Notes (Signed)
Pt. c/o's increased coughing and Schorr,NP paged and order given.

## 2016-07-09 DIAGNOSIS — E039 Hypothyroidism, unspecified: Secondary | ICD-10-CM

## 2016-07-09 DIAGNOSIS — G8929 Other chronic pain: Secondary | ICD-10-CM

## 2016-07-09 DIAGNOSIS — J4531 Mild persistent asthma with (acute) exacerbation: Secondary | ICD-10-CM

## 2016-07-09 LAB — PROCALCITONIN: Procalcitonin: 0.1 ng/mL

## 2016-07-09 MED ORDER — IPRATROPIUM-ALBUTEROL 0.5-2.5 (3) MG/3ML IN SOLN
3.0000 mL | Freq: Three times a day (TID) | RESPIRATORY_TRACT | Status: DC
  Administered 2016-07-09 – 2016-07-10 (×2): 3 mL via RESPIRATORY_TRACT
  Filled 2016-07-09 (×2): qty 3

## 2016-07-09 MED ORDER — METHYLPREDNISOLONE SODIUM SUCC 125 MG IJ SOLR
60.0000 mg | Freq: Two times a day (BID) | INTRAMUSCULAR | Status: DC
Start: 2016-07-09 — End: 2016-07-11
  Administered 2016-07-09 – 2016-07-11 (×4): 60 mg via INTRAVENOUS
  Filled 2016-07-09 (×4): qty 2

## 2016-07-09 NOTE — Progress Notes (Addendum)
PROGRESS NOTE    [name]  [id number] DOB: 21-Jun-1960 DOA: 07/07/2016   PCP: Beatris Si   Chief Complaint  Patient presents with  . Shortness of Breath  . Chest Pain    Brief Narrative:  56 year old female history of anxiety, asthma, depression, hypertension, presented to the emergency department with complaints of shortness of breath cough and wheezing which started several days ago. She also had fevers at home. Currently complaining of cough and continued shortness of breath. Admitted for acute respiratory failure and asthma exacerbation.  Assessment & Plan   Acute hypoxic respiratory failure/asthma exacerbation and possible community-acquired pneumonia - SPO2 was 86% on room air upon admission - Chest x-ray: Bibasilar atelectasis or infiltrates, right worse than left - Placed on azithromycin and ceftriaxone - Respiratory viral panel positive for metapneumovirus, influenza negative - Strep pneumoniae urine antigen negative - attempt to wean off oxygen  - place back on solumedrol today as pt with worsening wheezing  - Continue nebulizer treatments, Singulair, Qvar - reassess in Am and possible d/c if able to take off IV Solumedrol   Chest tightness - Denies further chest pain - EKG unremarkable - Troponin negative thus far  Chronic pain - Continue oxycodone, Robaxin  Hypothyroidism - Continue Synthroid  Depression, anxiety, insomnia - Continue Klonopin, Cymbalta, trazodone  Constipation - MiraLAX ordered  DVT Prophylaxis  Lovenox  Code Status: Full  Family Communication: None at bedside  Disposition Plan: Likely home in AM if abel to take off IV Solumedrol.   Consultants None  Procedures  None  Antibiotics   Anti-infectives    Start     Dose/Rate Route Frequency Ordered Stop   07/08/16 1000  cefTRIAXone (ROCEPHIN) 1 g in dextrose 5 % 50 mL IVPB     1 g 100 mL/hr over 30 Minutes Intravenous Every 24 hours 07/07/16 1241 07/14/16  0959   07/08/16 1000  azithromycin (ZITHROMAX) 500 mg in dextrose 5 % 250 mL IVPB     500 mg 250 mL/hr over 60 Minutes Intravenous Every 24 hours 07/07/16 1241 07/14/16 0959   07/07/16 1130  cefTRIAXone (ROCEPHIN) 1 g in dextrose 5 % 50 mL IVPB     1 g 100 mL/hr over 30 Minutes Intravenous  Once 07/07/16 1119 07/07/16 1216   07/07/16 1130  azithromycin (ZITHROMAX) 500 mg in dextrose 5 % 250 mL IVPB     500 mg 250 mL/hr over 60 Minutes Intravenous  Once 07/07/16 1119 07/07/16 1345      Subjective:   Pt reports more dyspnea and wheezing this AM.   Objective:   Vitals:   07/08/16 1941 07/08/16 2248 07/09/16 0615 07/09/16 0747  BP:  114/67 122/86   Pulse: 87 81 79   Resp: 18 17 18    Temp:  97.8 F (36.6 C) 97.4 F (36.3 C)   TempSrc:  Oral Oral   SpO2: 94% 92% 97% 94%  Weight:      Height:        Intake/Output Summary (Last 24 hours) at 07/09/16 1142 Last data filed at 07/09/16 0844  Gross per 24 hour  Intake              240 ml  Output             1775 ml  Net            -1535 ml   Filed Weights   07/08/16 1204  Weight: 100.2 kg (221 lb)    Exam  General:  Well developed, well nourished, NAD, appears stated age  64: NCAT, mucous membranes moist.   Cardiovascular: S1 S2 auscultated, no rubs, murmurs or gallops. Regular rate and rhythm.  Respiratory: Diminished breath sounds. Diffuse expiratory wheezing.   Abdomen: Soft, nontender, nondistended, + bowel sounds  Extremities: warm dry without cyanosis clubbing or edema  Neuro: AAOx3, nonfocal   Skin: Without rashes exudates or nodules  Psych: Normal affect and demeanor with intact judgement and insight  Data Reviewed: I have personally reviewed following labs and imaging studies  CBC:  Recent Labs Lab 07/07/16 1012 07/08/16 0647  WBC 7.0 7.2  NEUTROABS 4.7  --   HGB 14.0 13.3  HCT 43.2 40.7  MCV 87.1 86.4  PLT 139* [id number]   Basic Metabolic Panel:  Recent Labs Lab 07/07/16 1012 07/08/16 0647   NA 138 139  K 3.5 3.9  CL 107 106  CO2 21* 23  GLUCOSE 158* 164*  BUN 13 9  CREATININE 0.69 0.54  CALCIUM 8.8* 9.6   Cardiac Enzymes:  Recent Labs Lab 07/07/16 1012 07/07/16 1322  TROPONINI <0.03 <0.03   Urine analysis:    Component Value Date/Time   BILIRUBINUR n 02/13/2014 1351   PROTEINUR n 02/13/2014 1351   UROBILINOGEN negative 02/13/2014 1351   NITRITE n 02/13/2014 1351   LEUKOCYTESUR Negative 02/13/2014 1351   Recent Results (from the past 240 hour(s))  Respiratory Panel by PCR     Status: Abnormal   Collection Time: 07/07/16 12:39 PM  Result Value Ref Range Status   Adenovirus NOT DETECTED NOT DETECTED Final   Coronavirus 229E NOT DETECTED NOT DETECTED Final   Coronavirus HKU1 NOT DETECTED NOT DETECTED Final   Coronavirus NL63 NOT DETECTED NOT DETECTED Final   Coronavirus OC43 NOT DETECTED NOT DETECTED Final   Metapneumovirus DETECTED (A) NOT DETECTED Final   Rhinovirus / Enterovirus NOT DETECTED NOT DETECTED Final   Influenza A NOT DETECTED NOT DETECTED Final   Influenza B NOT DETECTED NOT DETECTED Final   Parainfluenza Virus 1 NOT DETECTED NOT DETECTED Final   Parainfluenza Virus 2 NOT DETECTED NOT DETECTED Final   Parainfluenza Virus 3 NOT DETECTED NOT DETECTED Final   Parainfluenza Virus 4 NOT DETECTED NOT DETECTED Final   Respiratory Syncytial Virus NOT DETECTED NOT DETECTED Final   Bordetella pertussis NOT DETECTED NOT DETECTED Final   Chlamydophila pneumoniae NOT DETECTED NOT DETECTED Final   Mycoplasma pneumoniae NOT DETECTED NOT DETECTED Final  Culture, blood (routine x 2) Call MD if unable to obtain prior to antibiotics being given     Status: None (Preliminary result)   Collection Time: 07/07/16  5:05 PM  Result Value Ref Range Status   Specimen Description BLOOD LEFT ANTECUBITAL  Final   Special Requests BOTTLES DRAWN AEROBIC AND ANAEROBIC 5CC  Final   Culture NO GROWTH < 24 HOURS  Final   Report Status PENDING  Incomplete  Culture, blood  (routine x 2) Call MD if unable to obtain prior to antibiotics being given     Status: None (Preliminary result)   Collection Time: 07/07/16  5:15 PM  Result Value Ref Range Status   Specimen Description BLOOD LEFT HAND  Final   Special Requests BOTTLES DRAWN AEROBIC AND ANAEROBIC 5CC  Final   Culture NO GROWTH < 24 HOURS  Final   Report Status PENDING  Incomplete    Radiology Studies: No results found.  Scheduled Meds: . azithromycin  500 mg Intravenous Q24H  . beclomethasone  2 puff Inhalation BID  .  benzonatate  200 mg Oral TID  . cefTRIAXone (ROCEPHIN)  IV  1 g Intravenous Q24H  . DULoxetine  60 mg Oral BID  . enoxaparin (LOVENOX) injection  40 mg Subcutaneous Q24H  . ipratropium-albuterol  3 mL Nebulization QID  . levothyroxine  88 mcg Oral QAC breakfast  . methylPREDNISolone (SOLU-MEDROL) injection  60 mg Intravenous Q12H  . montelukast  10 mg Oral QHS  . pneumococcal 23 valent vaccine  0.5 mL Intramuscular Tomorrow-1000  . polyethylene glycol  17 g Oral Daily  . sodium chloride flush  3 mL Intravenous Q12H  . traZODone  100 mg Oral QHS   Continuous Infusions:   LOS: 1 day   Time Spent in minutes   30 minutes  MAGICK-Dela Sweeny MD. on 07/09/2016 at 11:42 AM  Between 7am to 7pm - Pager - [phone]  After 7pm go to [email or web address] - password [email or web address]  And look for the night coverage person covering for me after hours  [facility] Office  [phone]

## 2016-07-10 ENCOUNTER — Inpatient Hospital Stay (HOSPITAL_COMMUNITY): Payer: BLUE CROSS/BLUE SHIELD

## 2016-07-10 DIAGNOSIS — J9601 Acute respiratory failure with hypoxia: Secondary | ICD-10-CM

## 2016-07-10 LAB — BASIC METABOLIC PANEL
Anion gap: 7 (ref 5–15)
BUN: 12 mg/dL (ref 6–20)
CALCIUM: 9.3 mg/dL (ref 8.9–10.3)
CHLORIDE: 103 mmol/L (ref 101–111)
CO2: 27 mmol/L (ref 22–32)
CREATININE: 0.61 mg/dL (ref 0.44–1.00)
GFR calc Af Amer: >60 mL/min (ref >60)
GFR calc non Af Amer: >60 mL/min (ref >60)
Glucose, Bld: 209 mg/dL — ABNORMAL HIGH (ref 65–99)
Potassium: 4.1 mmol/L (ref 3.5–5.1)
SODIUM: 137 mmol/L (ref 135–145)

## 2016-07-10 LAB — CBC
HCT: 39.5 % (ref 36.0–46.0)
HEMOGLOBIN: 13 g/dL (ref 12.0–15.0)
MCH: 28.6 pg (ref 26.0–34.0)
MCHC: 32.9 g/dL (ref 30.0–36.0)
MCV: 87 fL (ref 78.0–100.0)
Platelets: 215 10*3/uL (ref 150–400)
RBC: 4.54 MIL/uL (ref 3.87–5.11)
RDW: 13.8 % (ref 11.5–15.5)
WBC: 7 10*3/uL (ref 4.0–10.5)

## 2016-07-10 MED ORDER — LEVALBUTEROL HCL 1.25 MG/0.5ML IN NEBU
1.2500 mg | INHALATION_SOLUTION | Freq: Three times a day (TID) | RESPIRATORY_TRACT | Status: DC
  Administered 2016-07-11 – 2016-07-12 (×4): 1.25 mg via RESPIRATORY_TRACT
  Filled 2016-07-10 (×6): qty 0.5

## 2016-07-10 MED ORDER — DIPHENHYDRAMINE HCL 25 MG PO CAPS
25.0000 mg | ORAL_CAPSULE | Freq: Four times a day (QID) | ORAL | Status: DC | PRN
  Administered 2016-07-10 – 2016-07-11 (×4): 25 mg via ORAL
  Filled 2016-07-10 (×4): qty 1

## 2016-07-10 MED ORDER — LEVALBUTEROL HCL 1.25 MG/0.5ML IN NEBU: 1.2500 mg | INHALATION_SOLUTION | Freq: Four times a day (QID) | RESPIRATORY_TRACT | Status: DC | PRN

## 2016-07-10 MED ORDER — SACCHAROMYCES BOULARDII 250 MG PO CAPS
250.0000 mg | ORAL_CAPSULE | Freq: Two times a day (BID) | ORAL | Status: DC
  Administered 2016-07-10 – 2016-07-12 (×4): 250 mg via ORAL
  Filled 2016-07-10 (×5): qty 1

## 2016-07-10 MED ORDER — LEVALBUTEROL HCL 1.25 MG/0.5ML IN NEBU
1.2500 mg | INHALATION_SOLUTION | Freq: Four times a day (QID) | RESPIRATORY_TRACT | Status: DC
  Administered 2016-07-10 (×2): 1.25 mg via RESPIRATORY_TRACT
  Filled 2016-07-10 (×2): qty 0.5

## 2016-07-10 MED ORDER — AZITHROMYCIN 500 MG PO TABS
500.0000 mg | ORAL_TABLET | Freq: Every day | ORAL | Status: DC
  Administered 2016-07-10: 500 mg via ORAL
  Filled 2016-07-10: qty 1

## 2016-07-10 NOTE — Care Management Note (Signed)
Case Management Note  Patient Details  Name: [name] MRN: [id number] Date of Birth: 28-Jan-1961  Subjective/Objective:     Admitted with Acute hypoxic respiratory failure/asthma exacerbation and possible community-acquired pneumonia. From home with husband. Independentt with ADL's PTA.     Berlinda Last (Spouse)     [phone]      PCP:  Norm Salt  Action/Plan: Plan is to d/c to home when medically stable. CM to f/u with disposition needs.  Expected Discharge Date:                  Expected Discharge Plan:  Home/Self Care  In-House Referral:     Discharge planning Services  CM Consult  Post Acute Care Choice:    Choice offered to:   patient  DME Arranged:  Nebulizer machine (referral made to [facility] @ [phone]) DME Agency:  [place]:    [facility] Agency:     Status of Service:  Completed, signed off  If discussed at [place] of Stay Meetings, dates discussed:    Additional Comments:  Sharin Mons, RN 07/10/2016, 1:17 PM

## 2016-07-10 NOTE — Progress Notes (Signed)
PROGRESS NOTE    [name]  [id number] DOB: Jun 11, 1960 DOA: 07/07/2016   PCP: Beatris Si   Chief Complaint  Patient presents with  . Shortness of Breath  . Chest Pain    Brief Narrative:  56 year old female history of anxiety, asthma, depression, hypertension, presented to the emergency department with complaints of shortness of breath cough and wheezing which started several days ago. She also had fevers at home. Currently complaining of cough and continued shortness of breath. Admitted for acute respiratory failure and asthma exacerbation.  Assessment & Plan   Acute hypoxic respiratory failure/asthma exacerbation and possible community-acquired pneumonia - SPO2 was 86% on room air upon admission - Chest x-ray: Bibasilar atelectasis or infiltrates, right worse than left - Placed on azithromycin and ceftriaxone - Respiratory viral panel positive for metapneumovirus, influenza negative - Strep pneumoniae urine antigen negative - was on solumedrol but says she is not tolerating well - asked for CT chest to re evaluate why pt slowly improving   Chest tightness - Denies further chest pain - EKG unremarkable - Troponin negative thus far  Chronic pain - Continue oxycodone, Robaxin  Hypothyroidism - Continue Synthroid  Depression, anxiety, insomnia - Continue Klonopin, Cymbalta, trazodone  Constipation - MiraLAX ordered  DVT Prophylaxis  Lovenox  Code Status: Full  Family Communication: None at bedside  Disposition Plan: Likely home in AM if less wheezing   Consultants None  Procedures  None  Antibiotics   Anti-infectives    Start     Dose/Rate Route Frequency Ordered Stop   07/10/16 1030  azithromycin (ZITHROMAX) tablet 500 mg     500 mg Oral Daily 07/10/16 1017 07/14/16 0959   07/08/16 1000  cefTRIAXone (ROCEPHIN) 1 g in dextrose 5 % 50 mL IVPB     1 g 100 mL/hr over 30 Minutes Intravenous Every 24 hours 07/07/16 1241 07/14/16 0959   07/08/16 1000  azithromycin (ZITHROMAX) 500 mg in dextrose 5 % 250 mL IVPB  Status:  Discontinued     500 mg 250 mL/hr over 60 Minutes Intravenous Every 24 hours 07/07/16 1241 07/10/16 1017   07/07/16 1130  cefTRIAXone (ROCEPHIN) 1 g in dextrose 5 % 50 mL IVPB     1 g 100 mL/hr over 30 Minutes Intravenous  Once 07/07/16 1119 07/07/16 1216   07/07/16 1130  azithromycin (ZITHROMAX) 500 mg in dextrose 5 % 250 mL IVPB     500 mg 250 mL/hr over 60 Minutes Intravenous  Once 07/07/16 1119 07/07/16 1345      Subjective:   Pt reports more dyspnea and wheezing this AM.   Objective:   Vitals:   07/10/16 0817 07/10/16 0819 07/10/16 1333 07/10/16 1417  BP:   (!) 144/81   Pulse:   85   Resp:   18   Temp:   98.2 F (36.8 C)   TempSrc:   Oral   SpO2: 90% 90% 94% 94%  Weight:      Height:        Intake/Output Summary (Last 24 hours) at 07/10/16 1420 Last data filed at 07/10/16 1335  Gross per 24 hour  Intake              720 ml  Output             3200 ml  Net            -2480 ml   Filed Weights   07/08/16 1204  Weight: 100.2 kg (221 lb)  Exam  General: Well developed, well nourished, NAD, appears stated age  HEENT: NCAT, mucous membranes moist.   Cardiovascular: S1 S2 auscultated, no rubs, murmurs or gallops. Regular rate and rhythm.  Respiratory: Diminished breath sounds. Diffuse expiratory wheezing still present   Abdomen: Soft, nontender, nondistended, + bowel sounds  Extremities: warm dry without cyanosis clubbing or edema  Neuro: AAOx3, nonfocal   Skin: Without rashes exudates or nodules  Psych: Normal affect and demeanor with intact judgement and insight  Data Reviewed: I have personally reviewed following labs and imaging studies  CBC:  Recent Labs Lab 07/07/16 1012 07/08/16 0647 07/10/16 0548  WBC 7.0 7.2 7.0  NEUTROABS 4.7  --   --   HGB 14.0 13.3 13.0  HCT 43.2 40.7 39.5  MCV 87.1 86.4 87.0  PLT 139* 171 [id number]   Basic Metabolic Panel:  Recent  Labs Lab 07/07/16 1012 07/08/16 0647 07/10/16 0548  NA 138 139 137  K 3.5 3.9 4.1  CL 107 106 103  CO2 21* 23 27  GLUCOSE 158* 164* 209*  BUN 13 9 12   CREATININE 0.69 0.54 0.61  CALCIUM 8.8* 9.6 9.3   Cardiac Enzymes:  Recent Labs Lab 07/07/16 1012 07/07/16 1322  TROPONINI <0.03 <0.03   Urine analysis:    Component Value Date/Time   BILIRUBINUR n 02/13/2014 1351   PROTEINUR n 02/13/2014 1351   UROBILINOGEN negative 02/13/2014 1351   NITRITE n 02/13/2014 1351   LEUKOCYTESUR Negative 02/13/2014 1351   Recent Results (from the past 240 hour(s))  Respiratory Panel by PCR     Status: Abnormal   Collection Time: 07/07/16 12:39 PM  Result Value Ref Range Status   Adenovirus NOT DETECTED NOT DETECTED Final   Coronavirus 229E NOT DETECTED NOT DETECTED Final   Coronavirus HKU1 NOT DETECTED NOT DETECTED Final   Coronavirus NL63 NOT DETECTED NOT DETECTED Final   Coronavirus OC43 NOT DETECTED NOT DETECTED Final   Metapneumovirus DETECTED (A) NOT DETECTED Final   Rhinovirus / Enterovirus NOT DETECTED NOT DETECTED Final   Influenza A NOT DETECTED NOT DETECTED Final   Influenza B NOT DETECTED NOT DETECTED Final   Parainfluenza Virus 1 NOT DETECTED NOT DETECTED Final   Parainfluenza Virus 2 NOT DETECTED NOT DETECTED Final   Parainfluenza Virus 3 NOT DETECTED NOT DETECTED Final   Parainfluenza Virus 4 NOT DETECTED NOT DETECTED Final   Respiratory Syncytial Virus NOT DETECTED NOT DETECTED Final   Bordetella pertussis NOT DETECTED NOT DETECTED Final   Chlamydophila pneumoniae NOT DETECTED NOT DETECTED Final   Mycoplasma pneumoniae NOT DETECTED NOT DETECTED Final  Culture, blood (routine x 2) Call MD if unable to obtain prior to antibiotics being given     Status: None (Preliminary result)   Collection Time: 07/07/16  5:05 PM  Result Value Ref Range Status   Specimen Description BLOOD LEFT ANTECUBITAL  Final   Special Requests BOTTLES DRAWN AEROBIC AND ANAEROBIC 5CC  Final    Culture NO GROWTH 3 DAYS  Final   Report Status PENDING  Incomplete  Culture, blood (routine x 2) Call MD if unable to obtain prior to antibiotics being given     Status: None (Preliminary result)   Collection Time: 07/07/16  5:15 PM  Result Value Ref Range Status   Specimen Description BLOOD LEFT HAND  Final   Special Requests BOTTLES DRAWN AEROBIC AND ANAEROBIC 5CC  Final   Culture NO GROWTH 3 DAYS  Final   Report Status PENDING  Incomplete    Radiology  Studies: Ct Chest Wo Contrast  Result Date: 07/10/2016 CLINICAL DATA:  Chronic cough and dyspnea, history of pneumonia EXAM: CT CHEST WITHOUT CONTRAST TECHNIQUE: Multidetector CT imaging of the chest was performed following the standard protocol without IV contrast. COMPARISON:  CXR 07/07/2016 and chest CT 06/30/2005 FINDINGS: Cardiovascular: Normal branch pattern of the great vessels. No aortic aneurysm. Normal sized cardiac chambers. No significant pericardial effusion or thickening. Mediastinum/Nodes: No lymphadenopathy. Patent trachea and mainstem bronchi. No thyromegaly or dominant mass. Lungs/Pleura: Small multifocal rounded and ill-defined ground-glass and part solid appearing right upper and middle lobe acinar nodular densities are noted measuring up to 5 mm more ground-glass subpleural density noted in the right lower lobe measuring up to 13 mm with adjacent areas of atelectasis and/or scarring. An 8 mm nodular density is also seen medially in the right lower lobe. Atelectasis and/or scarring noted in the lingula. No effusion or pneumothorax. The Upper Abdomen: Several well-circumscribed hypodensities of the liver are again noted the largest on current exam is 3.6 cm in the left pedicle. Statistically these are in keeping with cysts and/or hemangiomata. No adrenal mass identified. The visualized kidneys, pancreas, spleen and bowel loops are nonacute. Musculoskeletal: No chest wall mass or suspicious bone lesions identified. IMPRESSION: 1. Small  multifocal rounded ill-defined ground-glass apart solid-appearing right-sided acinar densities and nodularity. Findings more commonly associated with bronchopneumonia. However follow-up is recommended to ensure resolution and to exclude other etiologies including neoplasm. Non-contrast chest CT at 3-6 months is recommended. If the nodules are stable at time of repeat CT, then future CT at 18-24 months (from today's scan) is considered optional for low-risk patients, but is recommended for high-risk patients. This recommendation follows the consensus statement: Guidelines for Management of Incidental Pulmonary Nodules Detected on CT Images: From the Fleischner Society 2017; Radiology 2017; 284:228-243. 2. Hepatic cysts and/or hemangiomata Electronically Signed   By: Ashley Royalty M.D.   On: 07/10/2016 14:14    Scheduled Meds: . azithromycin  500 mg Oral Daily  . beclomethasone  2 puff Inhalation BID  . benzonatate  200 mg Oral TID  . cefTRIAXone (ROCEPHIN)  IV  1 g Intravenous Q24H  . DULoxetine  60 mg Oral BID  . enoxaparin (LOVENOX) injection  40 mg Subcutaneous Q24H  . levalbuterol  1.25 mg Nebulization Q6H  . levothyroxine  88 mcg Oral QAC breakfast  . methylPREDNISolone (SOLU-MEDROL) injection  60 mg Intravenous Q12H  . montelukast  10 mg Oral QHS  . polyethylene glycol  17 g Oral Daily  . saccharomyces boulardii  250 mg Oral BID  . sodium chloride flush  3 mL Intravenous Q12H  . traZODone  100 mg Oral QHS   Continuous Infusions:   LOS: 2 days   Time Spent in minutes   30 minutes  MAGICK-Cydne Grahn MD. on 07/10/2016 at 2:20 PM  Between 7am to 7pm - Pager - [phone]  After 7pm go to [email or web address] - password [email or web address]  And look for the night coverage person covering for me after hours  [facility] Office  [phone]

## 2016-07-11 LAB — CBC
HCT: 40.7 % (ref 36.0–46.0)
Hemoglobin: 13.1 g/dL (ref 12.0–15.0)
MCH: 28 pg (ref 26.0–34.0)
MCHC: 32.2 g/dL (ref 30.0–36.0)
MCV: 87 fL (ref 78.0–100.0)
PLATELETS: 223 10*3/uL (ref 150–400)
RBC: 4.68 MIL/uL (ref 3.87–5.11)
RDW: 13.8 % (ref 11.5–15.5)
WBC: 9.1 10*3/uL (ref 4.0–10.5)

## 2016-07-11 LAB — BASIC METABOLIC PANEL
ANION GAP: 11 (ref 5–15)
BUN: 14 mg/dL (ref 6–20)
CO2: 26 mmol/L (ref 22–32)
Calcium: 9.4 mg/dL (ref 8.9–10.3)
Chloride: 98 mmol/L — ABNORMAL LOW (ref 101–111)
Creatinine, Ser: 0.68 mg/dL (ref 0.44–1.00)
GFR calc Af Amer: >60 mL/min (ref >60)
Glucose, Bld: 239 mg/dL — ABNORMAL HIGH (ref 65–99)
POTASSIUM: 4.3 mmol/L (ref 3.5–5.1)
SODIUM: 135 mmol/L (ref 135–145)

## 2016-07-11 LAB — PROCALCITONIN

## 2016-07-11 MED ORDER — AMOXICILLIN-POT CLAVULANATE 875-125 MG PO TABS
1.0000 | ORAL_TABLET | Freq: Two times a day (BID) | ORAL | Status: DC
  Administered 2016-07-11 – 2016-07-12 (×2): 1 via ORAL
  Filled 2016-07-11 (×2): qty 1

## 2016-07-11 MED ORDER — DOXYCYCLINE HYCLATE 100 MG PO TABS
100.0000 mg | ORAL_TABLET | Freq: Two times a day (BID) | ORAL | Status: DC
  Filled 2016-07-11: qty 1

## 2016-07-11 MED ORDER — PREDNISONE 50 MG PO TABS
50.0000 mg | ORAL_TABLET | Freq: Every day | ORAL | Status: DC
  Administered 2016-07-12: 50 mg via ORAL
  Filled 2016-07-11: qty 1

## 2016-07-11 NOTE — Progress Notes (Signed)
RN notified Dr Doyle Askew that patient states that she cannot take doxycyline due to it making her "mouth taste like metal" and that causes her to vomit. RN will add it to her allergy list. Dr Doyle Askew gave RN verbal orders for augmentin 875 BID

## 2016-07-11 NOTE — Progress Notes (Signed)
PROGRESS NOTE    [name]  [id number] DOB: 21-Sep-1960 DOA: 07/07/2016   PCP: Beatris Si   Chief Complaint  Patient presents with  . Shortness of Breath  . Chest Pain    Brief Narrative:  56 year old female history of anxiety, asthma, depression, hypertension, presented to the emergency department with complaints of shortness of breath cough and wheezing which started several days ago. She also had fevers at home. Currently complaining of cough and continued shortness of breath. Admitted for acute respiratory failure and asthma exacerbation.  Assessment & Plan   Acute hypoxic respiratory failure/asthma exacerbation and multifocal bronchopneumonia, community-acquired pneumonia - SPO2 was 86% on room air upon admission - Chest x-ray: Bibasilar atelectasis or infiltrates, right worse than left - Placed on azithromycin and ceftriaxone, zithromax has been discontinued and pt currently only on Rocephin - will change to oral ABX doxycycline today  - Respiratory viral panel positive for metapneumovirus, influenza negative - Strep pneumoniae urine antigen negative - taper off solumedrol and place on oral prednisone   Chest tightness - Denies further chest pain - EKG unremarkable - Troponin negative thus far  Chronic pain - Continue oxycodone, Robaxin  Hypothyroidism - Continue Synthroid  Depression, anxiety, insomnia - Continue Klonopin, Cymbalta, trazodone  Constipation - MiraLAX ordered  DVT Prophylaxis  Lovenox  Code Status: Full  Family Communication: None at bedside  Disposition Plan: Likely home in AM  Consultants None  Procedures  None  Antibiotics   Anti-infectives    Start     Dose/Rate Route Frequency Ordered Stop   07/10/16 1030  azithromycin (ZITHROMAX) tablet 500 mg  Status:  Discontinued     500 mg Oral Daily 07/10/16 1017 07/10/16 1422   07/08/16 1000  cefTRIAXone (ROCEPHIN) 1 g in dextrose 5 % 50 mL IVPB     1 g 100 mL/hr  over 30 Minutes Intravenous Every 24 hours 07/07/16 1241 07/14/16 0959   07/08/16 1000  azithromycin (ZITHROMAX) 500 mg in dextrose 5 % 250 mL IVPB  Status:  Discontinued     500 mg 250 mL/hr over 60 Minutes Intravenous Every 24 hours 07/07/16 1241 07/10/16 1017   07/07/16 1130  cefTRIAXone (ROCEPHIN) 1 g in dextrose 5 % 50 mL IVPB     1 g 100 mL/hr over 30 Minutes Intravenous  Once 07/07/16 1119 07/07/16 1216   07/07/16 1130  azithromycin (ZITHROMAX) 500 mg in dextrose 5 % 250 mL IVPB     500 mg 250 mL/hr over 60 Minutes Intravenous  Once 07/07/16 1119 07/07/16 1345      Subjective:   Pt reports more dyspnea and wheezing this AM.   Objective:   Vitals:   07/10/16 2049 07/10/16 2239 07/11/16 0547 07/11/16 0908  BP:  135/79 (!) 147/85   Pulse:  84 70   Resp:  18 17   Temp:  98.2 F (36.8 C) 97.9 F (36.6 C)   TempSrc:  Oral Oral   SpO2: 94% 92% 94% 94%  Weight:      Height:        Intake/Output Summary (Last 24 hours) at 07/11/16 1252 Last data filed at 07/11/16 1118  Gross per 24 hour  Intake              360 ml  Output             2650 ml  Net            -2290 ml   Filed Weights   07/08/16  1204  Weight: 100.2 kg (221 lb)    Exam  General: Well developed, well nourished, NAD, appears stated age  HEENT: NCAT, mucous membranes moist.   Cardiovascular: S1 S2 auscultated, no rubs, murmurs or gallops. Regular rate and rhythm.  Respiratory: Diminished breath sounds. Minimal expiratory wheezing still present   Abdomen: Soft, nontender, nondistended, + bowel sounds  Extremities: warm dry without cyanosis clubbing or edema  Neuro: AAOx3, nonfocal   Skin: Without rashes exudates or nodules  Psych: Normal affect and demeanor with intact judgement and insight  Data Reviewed: I have personally reviewed following labs and imaging studies  CBC:  Recent Labs Lab 07/07/16 1012 07/08/16 0647 07/10/16 0548 07/11/16 0617  WBC 7.0 7.2 7.0 9.1  NEUTROABS 4.7  --    --   --   HGB 14.0 13.3 13.0 13.1  HCT 43.2 40.7 39.5 40.7  MCV 87.1 86.4 87.0 87.0  PLT 139* 171 215 [id number]   Basic Metabolic Panel:  Recent Labs Lab 07/07/16 1012 07/08/16 0647 07/10/16 0548 07/11/16 0617  NA 138 139 137 135  K 3.5 3.9 4.1 4.3  CL 107 106 103 98*  CO2 21* 23 27 26   GLUCOSE 158* 164* 209* 239*  BUN 13 9 12 14   CREATININE 0.69 0.54 0.61 0.68  CALCIUM 8.8* 9.6 9.3 9.4   Cardiac Enzymes:  Recent Labs Lab 07/07/16 1012 07/07/16 1322  TROPONINI <0.03 <0.03   Urine analysis:    Component Value Date/Time   BILIRUBINUR n 02/13/2014 1351   PROTEINUR n 02/13/2014 1351   UROBILINOGEN negative 02/13/2014 1351   NITRITE n 02/13/2014 1351   LEUKOCYTESUR Negative 02/13/2014 1351   Recent Results (from the past 240 hour(s))  Respiratory Panel by PCR     Status: Abnormal   Collection Time: 07/07/16 12:39 PM  Result Value Ref Range Status   Adenovirus NOT DETECTED NOT DETECTED Final   Coronavirus 229E NOT DETECTED NOT DETECTED Final   Coronavirus HKU1 NOT DETECTED NOT DETECTED Final   Coronavirus NL63 NOT DETECTED NOT DETECTED Final   Coronavirus OC43 NOT DETECTED NOT DETECTED Final   Metapneumovirus DETECTED (A) NOT DETECTED Final   Rhinovirus / Enterovirus NOT DETECTED NOT DETECTED Final   Influenza A NOT DETECTED NOT DETECTED Final   Influenza B NOT DETECTED NOT DETECTED Final   Parainfluenza Virus 1 NOT DETECTED NOT DETECTED Final   Parainfluenza Virus 2 NOT DETECTED NOT DETECTED Final   Parainfluenza Virus 3 NOT DETECTED NOT DETECTED Final   Parainfluenza Virus 4 NOT DETECTED NOT DETECTED Final   Respiratory Syncytial Virus NOT DETECTED NOT DETECTED Final   Bordetella pertussis NOT DETECTED NOT DETECTED Final   Chlamydophila pneumoniae NOT DETECTED NOT DETECTED Final   Mycoplasma pneumoniae NOT DETECTED NOT DETECTED Final  Culture, blood (routine x 2) Call MD if unable to obtain prior to antibiotics being given     Status: None (Preliminary result)    Collection Time: 07/07/16  5:05 PM  Result Value Ref Range Status   Specimen Description BLOOD LEFT ANTECUBITAL  Final   Special Requests BOTTLES DRAWN AEROBIC AND ANAEROBIC 5CC  Final   Culture NO GROWTH 3 DAYS  Final   Report Status PENDING  Incomplete  Culture, blood (routine x 2) Call MD if unable to obtain prior to antibiotics being given     Status: None (Preliminary result)   Collection Time: 07/07/16  5:15 PM  Result Value Ref Range Status   Specimen Description BLOOD LEFT HAND  Final  Special Requests BOTTLES DRAWN AEROBIC AND ANAEROBIC 5CC  Final   Culture NO GROWTH 3 DAYS  Final   Report Status PENDING  Incomplete    Radiology Studies: Ct Chest Wo Contrast  Result Date: 07/10/2016 CLINICAL DATA:  Chronic cough and dyspnea, history of pneumonia EXAM: CT CHEST WITHOUT CONTRAST TECHNIQUE: Multidetector CT imaging of the chest was performed following the standard protocol without IV contrast. COMPARISON:  CXR 07/07/2016 and chest CT 06/30/2005 FINDINGS: Cardiovascular: Normal branch pattern of the great vessels. No aortic aneurysm. Normal sized cardiac chambers. No significant pericardial effusion or thickening. Mediastinum/Nodes: No lymphadenopathy. Patent trachea and mainstem bronchi. No thyromegaly or dominant mass. Lungs/Pleura: Small multifocal rounded and ill-defined ground-glass and part solid appearing right upper and middle lobe acinar nodular densities are noted measuring up to 5 mm more ground-glass subpleural density noted in the right lower lobe measuring up to 13 mm with adjacent areas of atelectasis and/or scarring. An 8 mm nodular density is also seen medially in the right lower lobe. Atelectasis and/or scarring noted in the lingula. No effusion or pneumothorax. The Upper Abdomen: Several well-circumscribed hypodensities of the liver are again noted the largest on current exam is 3.6 cm in the left pedicle. Statistically these are in keeping with cysts and/or hemangiomata. No  adrenal mass identified. The visualized kidneys, pancreas, spleen and bowel loops are nonacute. Musculoskeletal: No chest wall mass or suspicious bone lesions identified. IMPRESSION: 1. Small multifocal rounded ill-defined ground-glass apart solid-appearing right-sided acinar densities and nodularity. Findings more commonly associated with bronchopneumonia. However follow-up is recommended to ensure resolution and to exclude other etiologies including neoplasm. Non-contrast chest CT at 3-6 months is recommended. If the nodules are stable at time of repeat CT, then future CT at 18-24 months (from today's scan) is considered optional for low-risk patients, but is recommended for high-risk patients. This recommendation follows the consensus statement: Guidelines for Management of Incidental Pulmonary Nodules Detected on CT Images: From the Fleischner Society 2017; Radiology 2017; 284:228-243. 2. Hepatic cysts and/or hemangiomata Electronically Signed   By: Ashley Royalty M.D.   On: 07/10/2016 14:14    Scheduled Meds: . beclomethasone  2 puff Inhalation BID  . benzonatate  200 mg Oral TID  . cefTRIAXone (ROCEPHIN)  IV  1 g Intravenous Q24H  . DULoxetine  60 mg Oral BID  . enoxaparin (LOVENOX) injection  40 mg Subcutaneous Q24H  . levalbuterol  1.25 mg Nebulization TID  . levothyroxine  88 mcg Oral QAC breakfast  . methylPREDNISolone (SOLU-MEDROL) injection  60 mg Intravenous Q12H  . montelukast  10 mg Oral QHS  . polyethylene glycol  17 g Oral Daily  . saccharomyces boulardii  250 mg Oral BID  . sodium chloride flush  3 mL Intravenous Q12H  . traZODone  100 mg Oral QHS   Continuous Infusions:   LOS: 3 days   Time Spent in minutes   30 minutes  MAGICK-Tydus Sanmiguel MD. on 07/11/2016 at 12:52 PM  Between 7am to 7pm - Pager - [phone]  After 7pm go to [email or web address] - password [email or web address]  And look for the night coverage person covering for me after hours  [facility] Office   [phone]

## 2016-07-12 LAB — CBC
HEMATOCRIT: 40.3 % (ref 36.0–46.0)
HEMOGLOBIN: 12.8 g/dL (ref 12.0–15.0)
MCH: 27.8 pg (ref 26.0–34.0)
MCHC: 31.8 g/dL (ref 30.0–36.0)
MCV: 87.4 fL (ref 78.0–100.0)
Platelets: 236 10*3/uL (ref 150–400)
RBC: 4.61 MIL/uL (ref 3.87–5.11)
RDW: 13.9 % (ref 11.5–15.5)
WBC: 9.3 10*3/uL (ref 4.0–10.5)

## 2016-07-12 LAB — CULTURE, BLOOD (ROUTINE X 2)
CULTURE: NO GROWTH
CULTURE: NO GROWTH

## 2016-07-12 LAB — BASIC METABOLIC PANEL
ANION GAP: 9 (ref 5–15)
BUN: 17 mg/dL (ref 6–20)
CALCIUM: 9.3 mg/dL (ref 8.9–10.3)
CO2: 31 mmol/L (ref 22–32)
Chloride: 98 mmol/L — ABNORMAL LOW (ref 101–111)
Creatinine, Ser: 0.75 mg/dL (ref 0.44–1.00)
GFR calc non Af Amer: >60 mL/min (ref >60)
Glucose, Bld: 120 mg/dL — ABNORMAL HIGH (ref 65–99)
POTASSIUM: 3.9 mmol/L (ref 3.5–5.1)
Sodium: 138 mmol/L (ref 135–145)

## 2016-07-12 MED ORDER — SACCHAROMYCES BOULARDII 250 MG PO CAPS: 250.0000 mg | ORAL_CAPSULE | Freq: Two times a day (BID) | ORAL | 0 refills | Status: DC

## 2016-07-12 MED ORDER — HYDROCODONE-HOMATROPINE 5-1.5 MG/5ML PO SYRP: 5.0000 mL | ORAL_SOLUTION | Freq: Four times a day (QID) | ORAL | 0 refills | Status: DC | PRN

## 2016-07-12 MED ORDER — CLONAZEPAM 1 MG PO TABS: 1.0000 mg | ORAL_TABLET | Freq: Three times a day (TID) | ORAL | 0 refills | Status: AC | PRN

## 2016-07-12 MED ORDER — PREDNISONE 10 MG PO TABS: ORAL_TABLET | ORAL | 0 refills | Status: DC

## 2016-07-12 MED ORDER — BENZONATATE 200 MG PO CAPS: 200.0000 mg | ORAL_CAPSULE | Freq: Three times a day (TID) | ORAL | 0 refills | Status: DC

## 2016-07-12 MED ORDER — AMOXICILLIN-POT CLAVULANATE 875-125 MG PO TABS: 1.0000 | ORAL_TABLET | Freq: Two times a day (BID) | ORAL | 0 refills | Status: DC

## 2016-07-12 NOTE — Discharge Summary (Signed)
Physician Discharge Summary  [name] [id number] DOB: 17-Oct-1960 DOA: 07/07/2016  PCP: Beatris Si  Admit date: 07/07/2016 Discharge date: 07/12/2016  Recommendations for Outpatient Follow-up:  1. Pt will need to follow up with PCP in 2-3 weeks post discharge 2. Please obtain BMP to evaluate electrolytes and kidney function 3. Please also check CBC to evaluate Hg and Hct levels 4. Pt advised to complete therapy with ABX 5. Complete prednisone taper   Discharge Diagnoses:  Active Problems:   Lobar pneumonia (HCC)   Acute respiratory failure (HCC)   Asthma exacerbation   Chronic pain   Hypothyroidism   Depression with anxiety  Discharge Condition: Stable  Diet recommendation: Heart healthy diet discussed in details   Brief Narrative:  56 year old female history of anxiety, asthma, depression, hypertension, presented to the emergency department with complaints of shortness of breath cough and wheezing which started several days ago. She also had fevers at home. Currently complaining of cough and continued shortness of breath. Admitted for acute respiratory failure and asthma exacerbation.  Assessment & Plan   Acute hypoxic respiratory failure/asthma exacerbation and multifocal bronchopneumonia, community-acquired pneumonia - SPO2 was 86% on room air upon admission - Chest x-ray: Bibasilar atelectasis or infiltrates, right worse than left - Placed on azithromycin and ceftriaxone - ABX changed to Augmentin for pt to complete therapy  - Respiratory viral panel positive for metapneumovirus, influenza negative - Strep pneumoniae urine antigen negative - taper off prednisone as outlined below   Chest tightness - Denies further chest pain - EKG unremarkable - Troponin negative thus far   Chronic pain - Continue oxycodone, Robaxin  Hypothyroidism - Continue Synthroid  Depression, anxiety, insomnia - Continue Klonopin, Cymbalta,  trazodone  Constipation - MiraLAX provided   Code Status: Full  Family Communication: None at bedside  Disposition Plan: home   Consultants None  Procedures  None   Procedures/Studies: Dg Chest 2 View  Result Date: 07/07/2016 CLINICAL DATA:  Fever, cough for 2 weeks EXAM: CHEST  2 VIEW COMPARISON:  03/01/2014 FINDINGS: Bibasilar airspace opacities, right greater than left could reflect atelectasis or infiltrates. No effusions. Heart is normal size. No acute bony abnormality. IMPRESSION: Bibasilar atelectasis or infiltrates, right worse than left. Electronically Signed   By: Rolm Baptise M.D.   On: 07/07/2016 10:37   Ct Chest Wo Contrast  Result Date: 07/10/2016 CLINICAL DATA:  Chronic cough and dyspnea, history of pneumonia EXAM: CT CHEST WITHOUT CONTRAST TECHNIQUE: Multidetector CT imaging of the chest was performed following the standard protocol without IV contrast. COMPARISON:  CXR 07/07/2016 and chest CT 06/30/2005 FINDINGS: Cardiovascular: Normal branch pattern of the great vessels. No aortic aneurysm. Normal sized cardiac chambers. No significant pericardial effusion or thickening. Mediastinum/Nodes: No lymphadenopathy. Patent trachea and mainstem bronchi. No thyromegaly or dominant mass. Lungs/Pleura: Small multifocal rounded and ill-defined ground-glass and part solid appearing right upper and middle lobe acinar nodular densities are noted measuring up to 5 mm more ground-glass subpleural density noted in the right lower lobe measuring up to 13 mm with adjacent areas of atelectasis and/or scarring. An 8 mm nodular density is also seen medially in the right lower lobe. Atelectasis and/or scarring noted in the lingula. No effusion or pneumothorax. The Upper Abdomen: Several well-circumscribed hypodensities of the liver are again noted the largest on current exam is 3.6 cm in the left pedicle. Statistically these are in keeping with cysts and/or hemangiomata. No adrenal mass  identified. The visualized kidneys, pancreas, spleen and bowel loops are  nonacute. Musculoskeletal: No chest wall mass or suspicious bone lesions identified. IMPRESSION: 1. Small multifocal rounded ill-defined ground-glass apart solid-appearing right-sided acinar densities and nodularity. Findings more commonly associated with bronchopneumonia. However follow-up is recommended to ensure resolution and to exclude other etiologies including neoplasm. Non-contrast chest CT at 3-6 months is recommended. If the nodules are stable at time of repeat CT, then future CT at 18-24 months (from today's scan) is considered optional for low-risk patients, but is recommended for high-risk patients. This recommendation follows the consensus statement: Guidelines for Management of Incidental Pulmonary Nodules Detected on CT Images: From the Fleischner Society 2017; Radiology 2017; 284:228-243. 2. Hepatic cysts and/or hemangiomata Electronically Signed   By: Ashley Royalty M.D.   On: 07/10/2016 14:14     Discharge Exam: Vitals:   07/11/16 2105 07/12/16 0503  BP: (!) 149/78 123/84  Pulse: 81 72  Resp: 19 19  Temp: 98.4 F (36.9 C) 97.5 F (36.4 C)   Vitals:   07/11/16 2049 07/11/16 2105 07/12/16 0503 07/12/16 0933  BP:  (!) 149/78 123/84   Pulse:  81 72   Resp:  19 19   Temp:  98.4 F (36.9 C) 97.5 F (36.4 C)   TempSrc:   Oral   SpO2: 96% 96% 97% 98%  Weight:      Height:        General: Pt is alert, follows commands appropriately, not in acute distress Cardiovascular: Regular rate and rhythm, S1/S2 +, no murmurs, no rubs, no gallops Respiratory: Clear to auscultation bilaterally, no wheezing, diminished breath sounds at bases  Abdominal: Soft, non tender, non distended, bowel sounds +, no guarding Extremities: no edema, no cyanosis, pulses palpable bilaterally DP and PT Neuro: Grossly nonfocal  Discharge Instructions  Discharge Instructions    Diet - low sodium heart healthy    Complete by:  As  directed    Increase activity slowly    Complete by:  As directed      Allergies as of 07/12/2016      Reactions   Bactrim [sulfamethoxazole-trimethoprim] Anaphylaxis   Other Anaphylaxis   All nuts   Pseudoephedrine Palpitations   Ciprofloxacin    SOB & blisters on throat      Medication List    STOP taking these medications   minocycline 50 MG capsule Commonly known as:  MINOCIN,DYNACIN     TAKE these medications   albuterol 108 (90 Base) MCG/ACT inhaler Commonly known as:  PROVENTIL HFA;VENTOLIN HFA Inhale 2 puffs into the lungs daily as needed for wheezing or shortness of breath (rescue inhaler).   amoxicillin-clavulanate 875-125 MG tablet Commonly known as:  AUGMENTIN Take 1 tablet by mouth every 12 (twelve) hours.   benzonatate 200 MG capsule Commonly known as:  TESSALON Take 1 capsule (200 mg total) by mouth 3 (three) times daily.   bisoprolol 5 MG tablet Commonly known as:  ZEBETA Take 2.5 mg by mouth daily.   clonazePAM 1 MG tablet Commonly known as:  KLONOPIN Take 1 tablet (1 mg total) by mouth 3 (three) times daily as needed for anxiety.   diphenhydrAMINE 25 MG tablet Commonly known as:  BENADRYL Take 25 mg by mouth every 6 (six) hours as needed for allergies.   DULoxetine 60 MG capsule Commonly known as:  CYMBALTA Take 60 mg by mouth 2 (two) times daily.   EPIPEN 2-PAK 0.3 mg/0.3 mL Soaj injection Generic drug:  EPINEPHrine Inject 0.3 mg into the muscle as needed.   ESTROVEN PO Take 1  tablet by mouth daily.   HYDROcodone-homatropine 5-1.5 MG/5ML syrup Commonly known as:  HYCODAN Take 5 mLs by mouth every 6 (six) hours as needed for cough.   levothyroxine 88 MCG tablet Commonly known as:  SYNTHROID, LEVOTHROID Take 88 mcg by mouth daily before breakfast. What changed:  Another medication with the same name was removed. Continue taking this medication, and follow the directions you see here.   mometasone 50 MCG/ACT nasal spray Commonly known  as:  NASONEX Place 2 sprays into the nose 2 (two) times daily.   polyethylene glycol packet Commonly known as:  MIRALAX / GLYCOLAX Take 17 g by mouth daily as needed for mild constipation.   predniSONE 10 MG tablet Commonly known as:  DELTASONE Take 50 mg tablet 07/13/2016 and taper down by 10 mg daily until completed Start taking on:  07/13/2016   promethazine 25 MG tablet Commonly known as:  PHENERGAN Take 1 tablet by mouth every 6 (six) hours as needed for nausea.   QVAR 80 MCG/ACT inhaler Generic drug:  beclomethasone Inhale 2 puffs into the lungs 2 (two) times daily.   saccharomyces boulardii 250 MG capsule Commonly known as:  FLORASTOR Take 1 capsule (250 mg total) by mouth 2 (two) times daily.   SINGULAIR 10 MG tablet Generic drug:  montelukast Take 10 mg by mouth at bedtime.   traMADol 50 MG tablet Commonly known as:  ULTRAM Take 100 mg by mouth every 6 (six) hours as needed for moderate pain or severe pain.   traZODone 50 MG tablet Commonly known as:  DESYREL Take 100 mg by mouth at bedtime.            Durable Medical Equipment        Start     Ordered   07/08/16 [phone]  For home use only DME Nebulizer machine  Once    Question:  Patient needs a nebulizer to treat with the following condition  Answer:  Asthma   07/08/16 0915     Follow-up Information    HEPLER,MARK, PA-C Follow up.   Specialty:  Physician Assistant Contact information: [address] [place] [address] [phone]        Faye Ramsay, MD Follow up.   Specialty:  Internal Medicine Why:  call with questions please [phone] Contact information: [address] [place] [address] [phone]            The results of significant diagnostics from this hospitalization (including imaging, microbiology, ancillary and laboratory) are listed below for reference.     Microbiology: Recent Results (from the past 240 hour(s))  Respiratory  Panel by PCR     Status: Abnormal   Collection Time: 07/07/16 12:39 PM  Result Value Ref Range Status   Adenovirus NOT DETECTED NOT DETECTED Final   Coronavirus 229E NOT DETECTED NOT DETECTED Final   Coronavirus HKU1 NOT DETECTED NOT DETECTED Final   Coronavirus NL63 NOT DETECTED NOT DETECTED Final   Coronavirus OC43 NOT DETECTED NOT DETECTED Final   Metapneumovirus DETECTED (A) NOT DETECTED Final   Rhinovirus / Enterovirus NOT DETECTED NOT DETECTED Final   Influenza A NOT DETECTED NOT DETECTED Final   Influenza B NOT DETECTED NOT DETECTED Final   Parainfluenza Virus 1 NOT DETECTED NOT DETECTED Final   Parainfluenza Virus 2 NOT DETECTED NOT DETECTED Final   Parainfluenza Virus 3 NOT DETECTED NOT DETECTED Final   Parainfluenza Virus 4 NOT DETECTED NOT DETECTED Final   Respiratory Syncytial  Virus NOT DETECTED NOT DETECTED Final   Bordetella pertussis NOT DETECTED NOT DETECTED Final   Chlamydophila pneumoniae NOT DETECTED NOT DETECTED Final   Mycoplasma pneumoniae NOT DETECTED NOT DETECTED Final  Culture, blood (routine x 2) Call MD if unable to obtain prior to antibiotics being given     Status: None (Preliminary result)   Collection Time: 07/07/16  5:05 PM  Result Value Ref Range Status   Specimen Description BLOOD LEFT ANTECUBITAL  Final   Special Requests BOTTLES DRAWN AEROBIC AND ANAEROBIC 5CC  Final   Culture NO GROWTH 4 DAYS  Final   Report Status PENDING  Incomplete  Culture, blood (routine x 2) Call MD if unable to obtain prior to antibiotics being given     Status: None (Preliminary result)   Collection Time: 07/07/16  5:15 PM  Result Value Ref Range Status   Specimen Description BLOOD LEFT HAND  Final   Special Requests BOTTLES DRAWN AEROBIC AND ANAEROBIC 5CC  Final   Culture NO GROWTH 4 DAYS  Final   Report Status PENDING  Incomplete     Labs: Basic Metabolic Panel:  Recent Labs Lab 07/07/16 1012 07/08/16 0647 07/10/16 0548 07/11/16 0617 07/12/16 0436  NA 138  139 137 135 138  K 3.5 3.9 4.1 4.3 3.9  CL 107 106 103 98* 98*  CO2 21* 23 27 26 31   GLUCOSE 158* 164* 209* 239* 120*  BUN 13 9 12 14 17   CREATININE 0.69 0.54 0.61 0.68 0.75  CALCIUM 8.8* 9.6 9.3 9.4 9.3   CBC:  Recent Labs Lab 07/07/16 1012 07/08/16 0647 07/10/16 0548 07/11/16 0617 07/12/16 0436  WBC 7.0 7.2 7.0 9.1 9.3  NEUTROABS 4.7  --   --   --   --   HGB 14.0 13.3 13.0 13.1 12.8  HCT 43.2 40.7 39.5 40.7 40.3  MCV 87.1 86.4 87.0 87.0 87.4  PLT 139* 171 215 223 236   Cardiac Enzymes:  Recent Labs Lab 07/07/16 1012 07/07/16 1322  TROPONINI <0.03 <0.03   BNP: BNP (last 3 results)  Recent Labs  07/07/16 1012  BNP 12.4   SIGNED: Time coordinating discharge: 30 minutes  Faye Ramsay, MD  [facility] 07/12/2016, 11:23 AM Pager [phone]  If 7PM-7AM, please contact night-coverage [email or web address] Password [email or web address]

## 2016-07-12 NOTE — Progress Notes (Signed)
Nsg Discharge Note  Admit Date:  07/07/2016 Discharge date: 07/12/2016   [name] to be D/C'd Home per MD order.  AVS completed.  Copy for chart, and copy for patient signed, and dated. Patient/caregiver able to verbalize understanding.  Discharge Medication: Allergies as of 07/12/2016      Reactions   Bactrim [sulfamethoxazole-trimethoprim] Anaphylaxis   Other Anaphylaxis   All nuts   Pseudoephedrine Palpitations   Ciprofloxacin    SOB & blisters on throat      Medication List    STOP taking these medications   minocycline 50 MG capsule Commonly known as:  MINOCIN,DYNACIN     TAKE these medications   albuterol 108 (90 Base) MCG/ACT inhaler Commonly known as:  PROVENTIL HFA;VENTOLIN HFA Inhale 2 puffs into the lungs daily as needed for wheezing or shortness of breath (rescue inhaler).   amoxicillin-clavulanate 875-125 MG tablet Commonly known as:  AUGMENTIN Take 1 tablet by mouth every 12 (twelve) hours.   benzonatate 200 MG capsule Commonly known as:  TESSALON Take 1 capsule (200 mg total) by mouth 3 (three) times daily.   bisoprolol 5 MG tablet Commonly known as:  ZEBETA Take 2.5 mg by mouth daily.   clonazePAM 1 MG tablet Commonly known as:  KLONOPIN Take 1 tablet (1 mg total) by mouth 3 (three) times daily as needed for anxiety.   diphenhydrAMINE 25 MG tablet Commonly known as:  BENADRYL Take 25 mg by mouth every 6 (six) hours as needed for allergies.   DULoxetine 60 MG capsule Commonly known as:  CYMBALTA Take 60 mg by mouth 2 (two) times daily.   EPIPEN 2-PAK 0.3 mg/0.3 mL Soaj injection Generic drug:  EPINEPHrine Inject 0.3 mg into the muscle as needed.   ESTROVEN PO Take 1 tablet by mouth daily.   HYDROcodone-homatropine 5-1.5 MG/5ML syrup Commonly known as:  HYCODAN Take 5 mLs by mouth every 6 (six) hours as needed for cough.   levothyroxine 88 MCG tablet Commonly known as:  SYNTHROID, LEVOTHROID Take 88 mcg by mouth daily before  breakfast. What changed:  Another medication with the same name was removed. Continue taking this medication, and follow the directions you see here.   mometasone 50 MCG/ACT nasal spray Commonly known as:  NASONEX Place 2 sprays into the nose 2 (two) times daily.   polyethylene glycol packet Commonly known as:  MIRALAX / GLYCOLAX Take 17 g by mouth daily as needed for mild constipation.   predniSONE 10 MG tablet Commonly known as:  DELTASONE Take 50 mg tablet 07/13/2016 and taper down by 10 mg daily until completed Start taking on:  07/13/2016   promethazine 25 MG tablet Commonly known as:  PHENERGAN Take 1 tablet by mouth every 6 (six) hours as needed for nausea.   QVAR 80 MCG/ACT inhaler Generic drug:  beclomethasone Inhale 2 puffs into the lungs 2 (two) times daily.   saccharomyces boulardii 250 MG capsule Commonly known as:  FLORASTOR Take 1 capsule (250 mg total) by mouth 2 (two) times daily.   SINGULAIR 10 MG tablet Generic drug:  montelukast Take 10 mg by mouth at bedtime.   traMADol 50 MG tablet Commonly known as:  ULTRAM Take 100 mg by mouth every 6 (six) hours as needed for moderate pain or severe pain.   traZODone 50 MG tablet Commonly known as:  DESYREL Take 100 mg by mouth at bedtime.            Durable Medical Equipment  Start     Ordered   07/08/16 0916  For home use only DME Nebulizer machine  Once    Question:  Patient needs a nebulizer to treat with the following condition  Answer:  Asthma   07/08/16 0915      Discharge Assessment: Vitals:   07/12/16 0503 07/12/16 1234  BP: 123/84 (!) 150/93  Pulse: 72 79  Resp: 19 (!) 24  Temp: 97.5 F (36.4 C) 97.9 F (36.6 C)   Skin clean, dry and intact without evidence of skin break down, no evidence of skin tears noted. IV catheter discontinued intact. Site without signs and symptoms of complications - no redness or edema noted at insertion site, patient denies c/o pain - only slight  tenderness at site.  Dressing with slight pressure applied.  D/c Instructions-Education: Discharge instructions given to patient/family with verbalized understanding. D/c education completed with patient/family including follow up instructions, medication list, d/c activities limitations if indicated, with other d/c instructions as indicated by MD - patient able to verbalize understanding, all questions fully answered. Patient instructed to return to ED, call 911, or call MD for any changes in condition.  Patient escorted via [place], and D/C home via private auto.  Salley Slaughter, RN 07/12/2016 2:02 PM

## 2016-07-12 NOTE — Progress Notes (Signed)
DC'd 02 and pt states she feels a little short of breath. Walked pt while measuring 02 sats. She was stable between 87 and 92%.

## 2016-07-12 NOTE — Discharge Instructions (Signed)
Community-Acquired Pneumonia, Adult °Pneumonia is an infection of the lungs. One type of pneumonia can happen while a person is in a hospital. A different type can happen when a person is not in a hospital (community-acquired pneumonia). It is easy for this kind to spread from person to person. It can spread to you if you breathe near an infected person who coughs or sneezes. Some symptoms include: °· A dry cough. °· A wet (productive) cough. °· Fever. °· Sweating. °· Chest pain. °Follow these instructions at home: °· Take over-the-counter and prescription medicines only as told by your doctor. °¨ Only take cough medicine if you are losing sleep. °¨ If you were prescribed an antibiotic medicine, take it as told by your doctor. Do not stop taking the antibiotic even if you start to feel better. °· Sleep with your head and neck raised (elevated). You can do this by putting a few pillows under your head, or you can sleep in a recliner. °· Do not use tobacco products. These include cigarettes, chewing tobacco, and e-cigarettes. If you need help quitting, ask your doctor. °· Drink enough water to keep your pee (urine) clear or pale yellow. °A shot (vaccine) can help prevent pneumonia. Shots are often suggested for: °· People older than 54 years of age. °· People older than 54 years of age: °¨ Who are having cancer treatment. °¨ Who have long-term (chronic) lung disease. °¨ Who have problems with their body's defense system (immune system). °You may also prevent pneumonia if you take these actions: °· Get the flu (influenza) shot every year. °· Go to the dentist as often as told. °· Wash your hands often. If soap and water are not available, use hand sanitizer. °Contact a doctor if: °· You have a fever. °· You lose sleep because your cough medicine does not help. °Get help right away if: °· You are short of breath and it gets worse. °· You have more chest pain. °· Your sickness gets worse. This is very serious if: °¨ You  are an older adult. °¨ Your body's defense system is weak. °· You cough up blood. °This information is not intended to replace advice given to you by your health care provider. Make sure you discuss any questions you have with your health care provider. °Document Released: 10/15/2007 Document Revised: 10/04/2015 Document Reviewed: 08/23/2014 °Elsevier Interactive Patient Education © 2017 [employer] ° °

## 2016-10-08 ENCOUNTER — Other Ambulatory Visit: Payer: Self-pay | Admitting: Physician Assistant

## 2016-10-08 DIAGNOSIS — R911 Solitary pulmonary nodule: Secondary | ICD-10-CM

## 2016-10-22 ENCOUNTER — Ambulatory Visit
Admission: RE | Admit: 2016-10-22 | Discharge: 2016-10-22 | Disposition: A | Discharge status: Home / Self Care | Payer: BLUE CROSS/BLUE SHIELD | Source: Ambulatory Visit | Attending: Physician Assistant | Admitting: Physician Assistant

## 2016-10-22 DIAGNOSIS — R911 Solitary pulmonary nodule: Secondary | ICD-10-CM

## 2017-08-17 ENCOUNTER — Encounter: Payer: Self-pay | Admitting: Obstetrics and Gynecology

## 2018-02-09 ENCOUNTER — Other Ambulatory Visit: Payer: Self-pay | Admitting: Nurse Practitioner

## 2018-02-24 ENCOUNTER — Telehealth: Payer: Self-pay | Admitting: *Deleted

## 2018-02-24 NOTE — Telephone Encounter (Signed)
Called and spoke with Myrene at [facility], gave appt for next week. Myrene will call the patient with information and instructions

## 2018-03-02 ENCOUNTER — Inpatient Hospital Stay: Payer: BLUE CROSS/BLUE SHIELD | Attending: Gynecology | Admitting: Gynecology

## 2018-03-02 ENCOUNTER — Encounter: Payer: Self-pay | Admitting: Gynecology

## 2018-03-02 VITALS — BP 126/90 | HR 78 | Temp 98.2°F | Resp 18 | Ht 71.0 in | Wt 233.0 lb

## 2018-03-02 DIAGNOSIS — D219 Benign neoplasm of connective and other soft tissue, unspecified: Secondary | ICD-10-CM | POA: Diagnosis not present

## 2018-03-02 DIAGNOSIS — Z90710 Acquired absence of both cervix and uterus: Secondary | ICD-10-CM | POA: Diagnosis not present

## 2018-03-02 NOTE — Patient Instructions (Signed)
Plan to follow up with Dr. Bethel Born for your well woman care.  Dr. Fermin Schwab also recommended ultrasound of the ovary due to long history of talcum powder use.  Please call for any needs or concerns.

## 2018-03-02 NOTE — Progress Notes (Signed)
Consult Note: Gyn-Onc   [name] 57 y.o. female  Chief Complaint  Patient presents with  . 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  Concern regarding possible increased risk of ovarian cancer secondary to talcum powder use  Plan: At this juncture the patient is healing well from excision of the granular tumor.  I would recommend continued observation of this area.  If a mass persists 3 to 4 months from now then I would recommend wider excision.  I reassured the patient this is not a malignancy and that the treatment is primary excision.  Patient has a number of concerns regarding her possible increased risk of ovarian cancer having used talcum powder for her entire life.  She is status post hysterectomy approximately year ago but her ovaries remain in place.  I would recommend that the patient have transvaginal ultrasound to assess the ovarian architecture.  I discouraged the use of Ca1 25 for screening.  HPI: 58 year old white female seen in consultation request of NP Thongteum regarding management of a newly diagnosed granular cell tumor of the left vulva.  Apparently the patient had this lesion present for over a year.  It was recently excised in the office and final pathology showed this to be a granular cell tumor.  The patient may have had a remote history of a similar tumor although but is uncertain.  From a gynecologic point of view she had a hysterectomy in the past.  Patient has a number of other concerns regarding ovarian cancer.  She does not have a family history of ovarian or breast cancer.  However the patient herself used talcum powder extensively prior to her hysterectomy and is aware that talcum powder may increase the risk of developing ovarian cancer.  Currently she denies any symptoms of ovarian cancer.  Review of Systems:10 point review of systems is negative except as noted in interval history.    Vitals: Blood pressure 126/90, pulse 78, temperature 98.2 F (36.8 C), temperature source Oral, resp. rate 18, height 5\' 11"  (1.803 m), weight 233 lb (105.7 kg), last menstrual period 02/26/2010, SpO2 97 %.  Physical Exam: General : The patient is a healthy woman in no acute distress.  HEENT: normocephalic, extraoccular movements normal; neck is supple without thyromegally  Lynphnodes: Supraclavicular and inguinal nodes not enlarged  Abdomen: Soft, non-tender, no ascites, no organomegally, no masses, no hernias  Pelvic:  EGBUS: Normal female.  The small incision site in the left labia majora is healing well.  Sutures that are extending well above the skin level are trimmed.  Palpation the area is nontender and all skin edges are coming together.  I do not palpate a mass in this area.   Lower extremities: No edema or varicosities. Normal range of motion      Allergies  Allergen Reactions  . Bactrim [Sulfamethoxazole-Trimethoprim] Anaphylaxis  . Other Anaphylaxis    All nuts   . Pseudoephedrine Palpitations  . Ciprofloxacin     SOB & blisters on throat    Past Medical History:  Diagnosis Date  . Anxiety   . Arthritis    "mid back; hips" (07/07/2016)  . Asthma   . Chronic bronchitis ([place])   . Chronic lower back pain   . Complication of anesthesia 2000   aspirated- had been sick prior to surgery- asthma  . Depression   . Heart murmur   . Hx of abnormal Pap smear 2006  no treatment to cervix  . Hypertension   . Hypothyroidism   . Lobular pneumonia 07/07/2016  . Migraine    "lately it's been weekly" (07/07/2016)  . Panic attack 03/04/13  . Pneumonia 2014  . Ruptured cervical disc   . Seasonal allergies   . Shortness of breath   . Thyroid disease    hypothyroidism    Past Surgical History:  Procedure Laterality Date  . ABDOMINAL HYSTERECTOMY  02/2010   [facility]--Dr. Joan Flores  . ANTERIOR AND POSTERIOR REPAIR N/A 04/05/2013   Procedure: ANTERIOR (CYSTOCELE) AND  POSTERIOR REPAIR (RECTOCELE);  Surgeon: Arloa Koh, MD;  Location: [facility];  Service: Gynecology;  Laterality: N/A;  . ANTERIOR CERVICAL DECOMP/DISCECTOMY FUSION N/A 03/03/2014   Procedure: ANTERIOR CERVICAL DECOMPRESSION/DISCECTOMY FUSION 2 LEVELS four/five, five/six;  Surgeon: Eustace Moore, MD;  Location: [facility];  Service: Neurosurgery;  Laterality: N/A;  . BACK SURGERY    . BLADDER SUSPENSION N/A 04/05/2013   Procedure: TRANSVAGINAL TAPE (TVT) PROCEDURE WITH CYSTO;  Surgeon: Arloa Koh, MD;  Location: [facility];  Service: Gynecology;  Laterality: N/A;  site- vagina and urethra  . ENDOMETRIAL BIOPSY  10-10-09   Benign fragments of endometrial polyp  . [place]  . LIPOSUCTION  2006   thighs, chin    Current Outpatient Medications  Medication Sig Dispense Refill  . albuterol (PROVENTIL HFA;VENTOLIN HFA) 108 (90 BASE) MCG/ACT inhaler Inhale 2 puffs into the lungs daily as needed for wheezing or shortness of breath (rescue inhaler).    . bisoprolol (ZEBETA) 5 MG tablet Take 2.5 mg by mouth daily.     . clonazePAM (KLONOPIN) 1 MG tablet Take 1 tablet (1 mg total) by mouth 3 (three) times daily as needed for anxiety. 20 tablet 0  . diphenhydrAMINE (BENADRYL) 25 MG tablet Take 25 mg by mouth every 6 (six) hours as needed for allergies.    . DULoxetine (CYMBALTA) 60 MG capsule Take 60 mg by mouth 2 (two) times daily.     Marland Kitchen EPIPEN 2-PAK 0.3 MG/0.3ML SOAJ injection Inject 0.3 mg into the muscle as needed.    Marland Kitchen levothyroxine (SYNTHROID, LEVOTHROID) 88 MCG tablet Take 88 mcg by mouth daily before breakfast. Patient can not take the generic brand levothyroxine, she takes the synthroid    . linaclotide (LINZESS) 290 MCG CAPS capsule Take 295 mcg by mouth daily before breakfast.    . minocycline (DYNACIN) 100 MG tablet Take 100 mg by mouth daily.    . Misc Natural Products (ESTROVEN ENERGY PO) Take 1 capsule by mouth daily.    . mometasone (NASONEX) 50 MCG/ACT nasal spray Place  2 sprays into the nose 2 (two) times daily.     . montelukast (SINGULAIR) 10 MG tablet Take 10 mg by mouth at bedtime.    . Multiple Vitamin (MULTIVITAMIN) tablet Take 1 tablet by mouth daily.    Marland Kitchen OVER THE COUNTER MEDICATION Take 1 capsule by mouth daily. Over the counter probiotic product    . polyethylene glycol (MIRALAX / GLYCOLAX) packet Take 17 g by mouth daily as needed for mild constipation.     . promethazine (PHENERGAN) 25 MG tablet Take 1 tablet by mouth every 6 (six) hours as needed for nausea.     Marland Kitchen QVAR 80 MCG/ACT inhaler Inhale 2 puffs into the lungs 2 (two) times daily.     . traMADol (ULTRAM) 50 MG tablet Take 100 mg by mouth every 6 (six) hours as needed for moderate pain  or severe pain.     . traZODone (DESYREL) 50 MG tablet Take 100 mg by mouth at bedtime.      No current facility-administered medications for this visit.     Social History   Socioeconomic History  . Marital status: Married    Spouse name: Not on file  . Number of children: Not on file  . Years of education: Not on file  . Highest education level: Not on file  Occupational History  . Not on file  Social Needs  . Financial resource strain: Not on file  . Food insecurity:    Worry: Not on file    Inability: Not on file  . Transportation needs:    Medical: Not on file    Non-medical: Not on file  Tobacco Use  . Smoking status: Never Smoker  . Smokeless tobacco: Never Used  Substance and Sexual Activity  . Alcohol use: Yes    Alcohol/week: 6.0 standard drinks    Types: 3 Glasses of wine, 3 Standard drinks or equivalent per week    Comment: Patient's states very rarely  . Drug use: No  . Sexual activity: Yes    Partners: Male    Birth control/protection: Surgical    Comment: [facility]  Lifestyle  . Physical activity:    Days per week: Not on file    Minutes per session: Not on file  . Stress: Not on file  Relationships  . Social connections:    Talks on phone: Not on file    Gets  together: Not on file    Attends religious service: Not on file    Active member of club or organization: Not on file    Attends meetings of clubs or organizations: Not on file    Relationship status: Not on file  . Intimate partner violence:    Fear of current or ex partner: Not on file    Emotionally abused: Not on file    Physically abused: Not on file    Forced sexual activity: Not on file  Other Topics Concern  . Not on file  Social History Narrative  . Not on file    Family History  Problem Relation Age of Onset  . Brain cancer Mother        dec with brain & lung CA  . Lung cancer Mother   . Bladder Cancer Father        dec age 85  . Prostate cancer Father   . Diabetes Maternal Grandmother   . Diabetes Maternal Grandfather   . Diabetes Paternal Grandmother   . Diabetes Paternal Grandfather       Marti Sleigh, MD 03/02/2018, 11:41 AM

## 2019-01-04 IMAGING — CT CT CHEST W/O CM
2 of 4 series · 15 of 36 positions shown, 18 images · non-contrast
Comparison: CXR 07/07/2016 and chest CT 06/30/2005

CLINICAL DATA: Chronic cough and dyspnea, history of pneumonia

EXAM:
CT CHEST WITHOUT CONTRAST
TECHNIQUE: Multidetector CT imaging of the chest was performed following the
standard protocol without IV contrast.

[Series 3: thorax 2.0 · axial · 0.81mm/px · z∈[+1141,+1451]mm · 12 of 175 slices shown, 15 images]
[im 10/175  mediastinal]
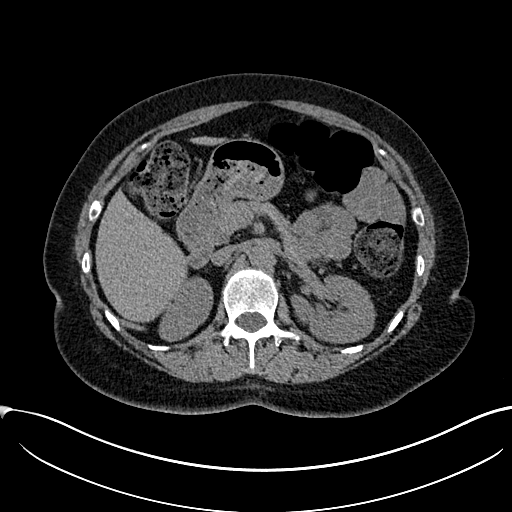
[im 10/175  lung]
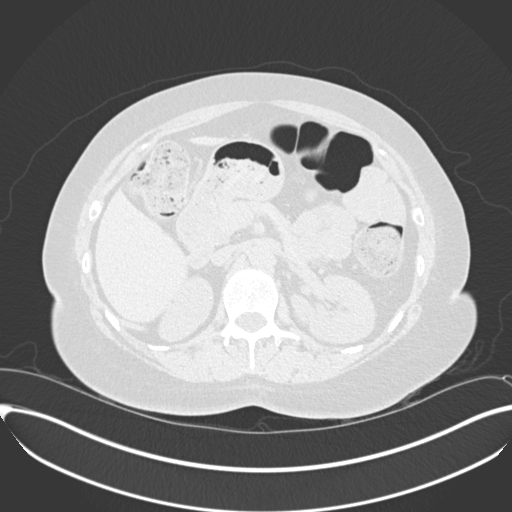
[im 28/175  lung]
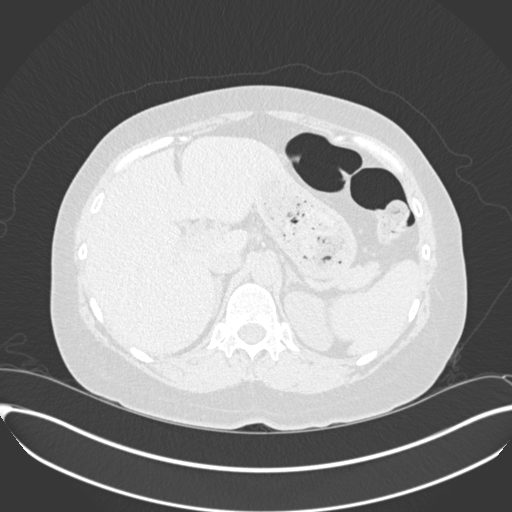
[im 37/175  lung]
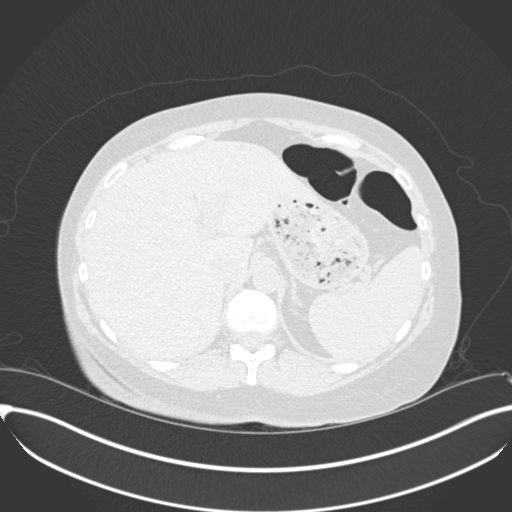
[im 55/175  lung]
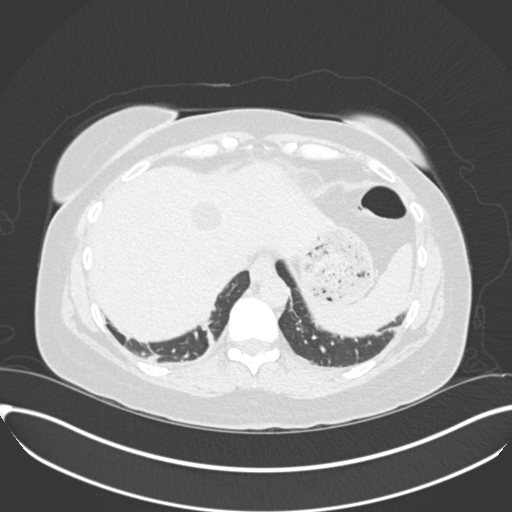
[im 65/175  mediastinal]
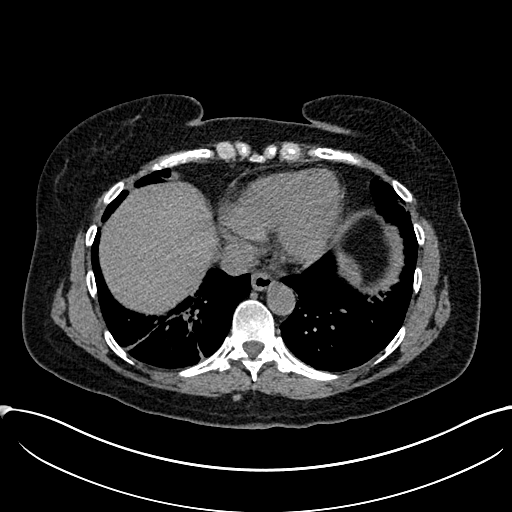
[im 65/175  lung]
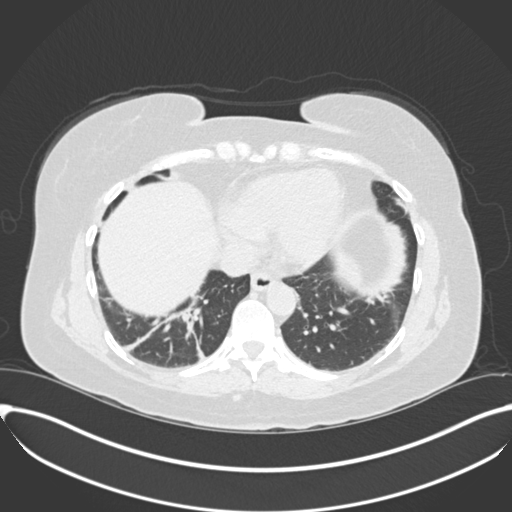
[im 83/175  lung]
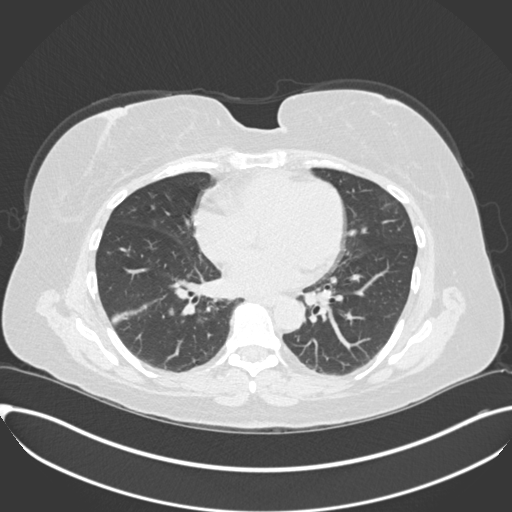
[im 92/175  lung]
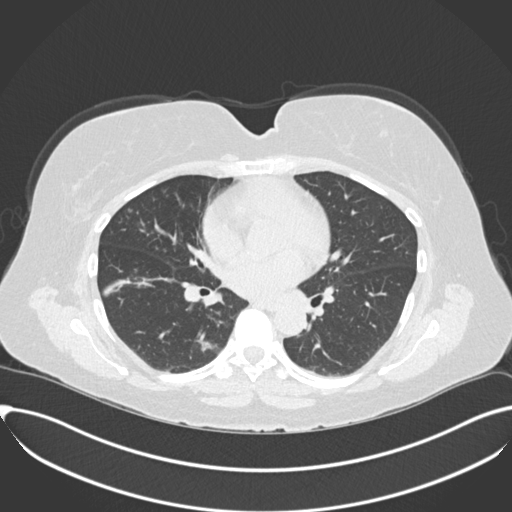
[im 110/175  lung]
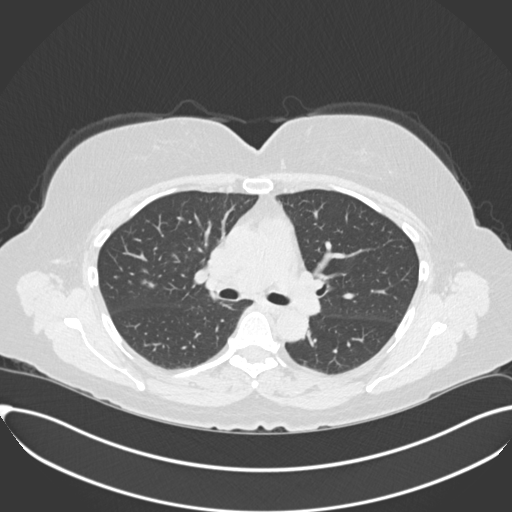
[im 120/175  mediastinal]
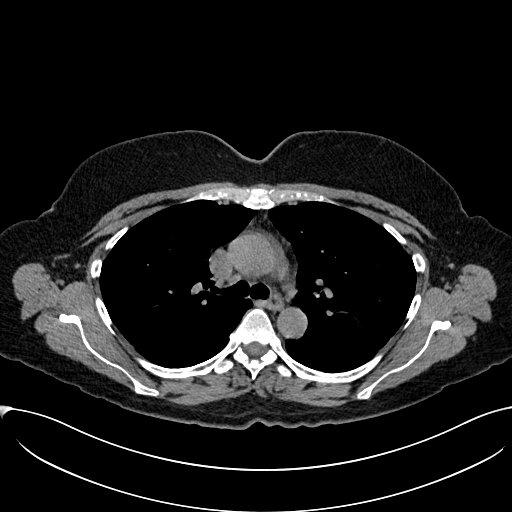
[im 120/175  lung]
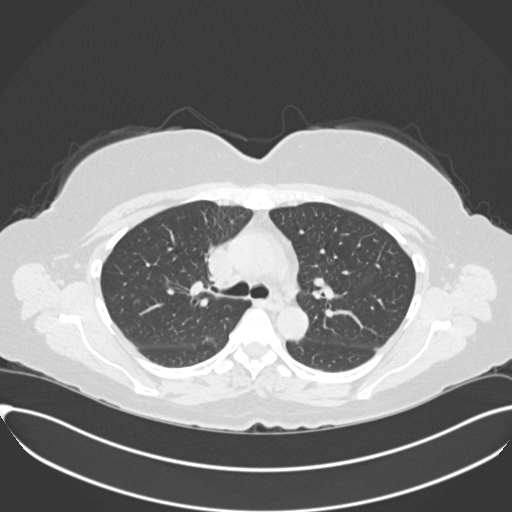
[im 138/175  lung]
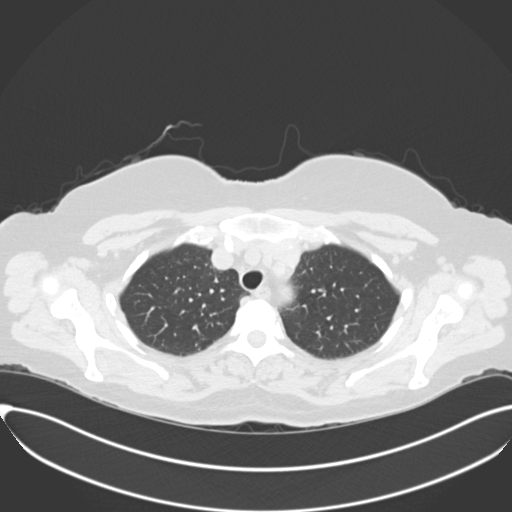
[im 147/175  lung]
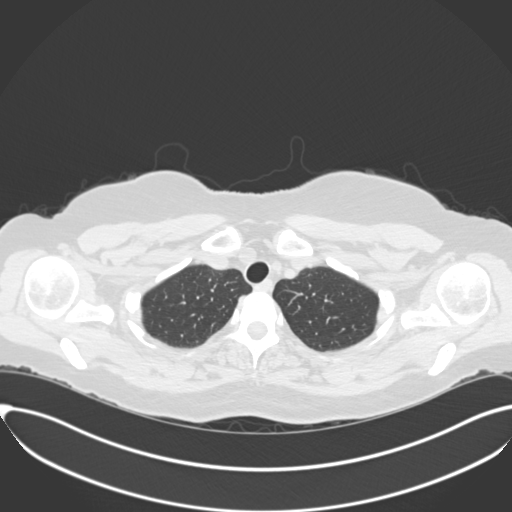
[im 165/175  lung]
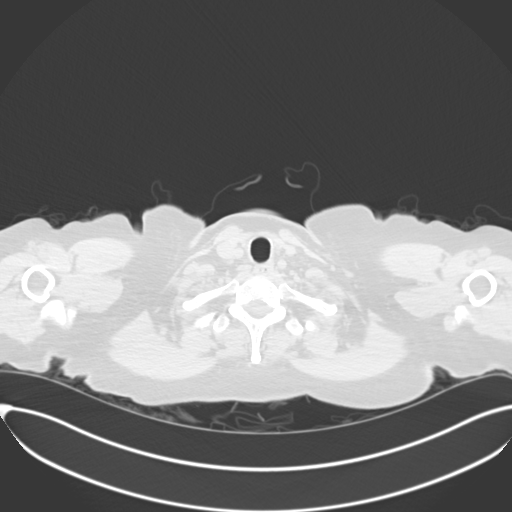

[Series 6: coronal · coronal · 0.68mm/px · 3 of 101 slices shown]
[im 21/101  lung]
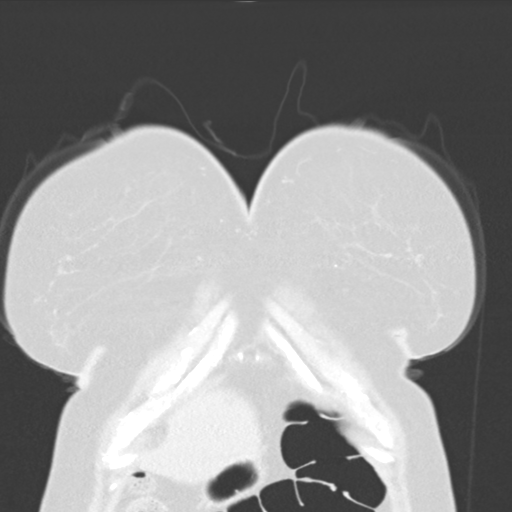
[im 41/101  lung]
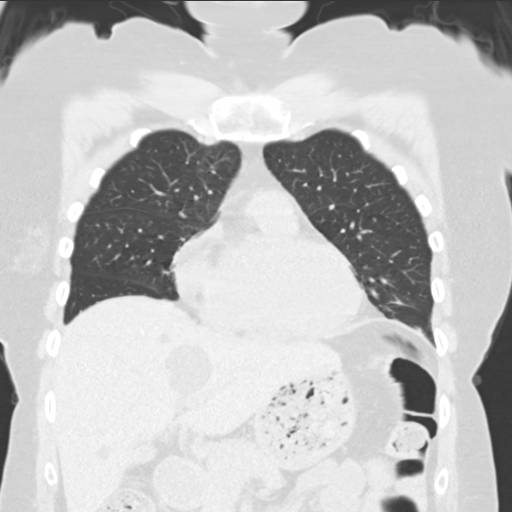
[im 61/101  lung]
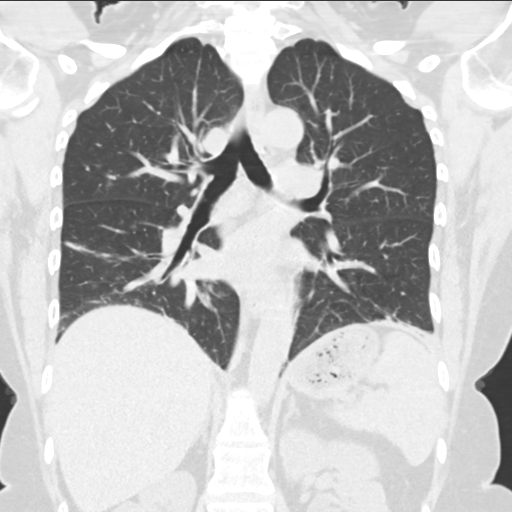

[15 of 36 positions shown; findings below may reference images not displayed]

FINDINGS: Cardiovascular: Normal branch pattern of the great vessels. No
aortic aneurysm. Normal sized cardiac chambers. No significant
pericardial effusion or thickening.

Mediastinum/Nodes: No lymphadenopathy. Patent trachea and mainstem
bronchi. No thyromegaly or dominant mass.

Lungs/Pleura: Small multifocal rounded and ill-defined ground-glass
and part solid appearing right upper and middle lobe acinar nodular
densities are noted measuring up to 5 mm more ground-glass
subpleural density noted in the right lower lobe measuring up to 13
mm with adjacent areas of atelectasis and/or scarring. An 8 mm
nodular density is also seen medially in the right lower lobe.
Atelectasis and/or scarring noted in the lingula. No effusion or
pneumothorax. The

Upper Abdomen: Several well-circumscribed hypodensities of the liver
are again noted the largest on current exam is 3.6 cm in the left
pedicle. Statistically these are in keeping with cysts and/or
hemangiomata. No adrenal mass identified. The visualized kidneys,
pancreas, spleen and bowel loops are nonacute.

Musculoskeletal: No chest wall mass or suspicious bone lesions
identified.
IMPRESSION: 1. Small multifocal rounded ill-defined ground-glass apart
solid-appearing right-sided acinar densities and nodularity.
Findings more commonly associated with bronchopneumonia. However
follow-up is recommended to ensure resolution and to exclude other
etiologies including neoplasm. Non-contrast chest CT at 3-6 months
is recommended. If the nodules are stable at time of repeat CT, then
future CT at 18-24 months (from today's scan) is considered optional
for low-risk patients, but is recommended for high-risk patients.
This recommendation follows the consensus statement: Guidelines for
Management of Incidental Pulmonary Nodules Detected on CT Images:
2. Hepatic cysts and/or hemangiomata

## 2019-04-18 IMAGING — CT CT CHEST W/O CM
1 series · 15 of 34 positions shown, 19 images · non-contrast
Comparison: 07/10/2016

CLINICAL DATA: Follow-up lung nodules

EXAM:
CT CHEST WITHOUT CONTRAST
TECHNIQUE: Multidetector CT imaging of the chest was performed following the
standard protocol without IV contrast.

[Series 2: chest w/(date) · axial · 0.80mm/px · z∈[-306,-24]mm · 15 of 167 slices shown, 19 images]
[im 13/167  mediastinal]
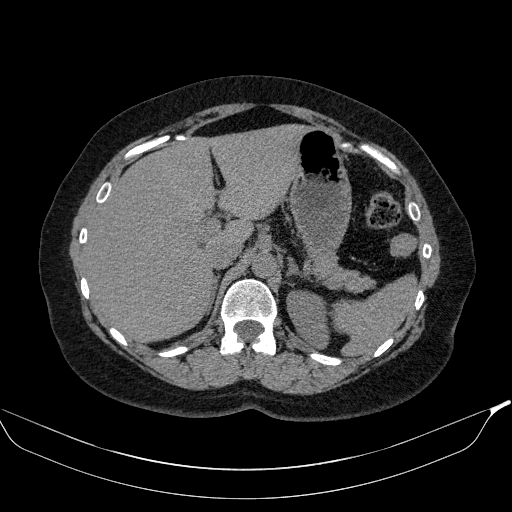
[im 13/167  lung]
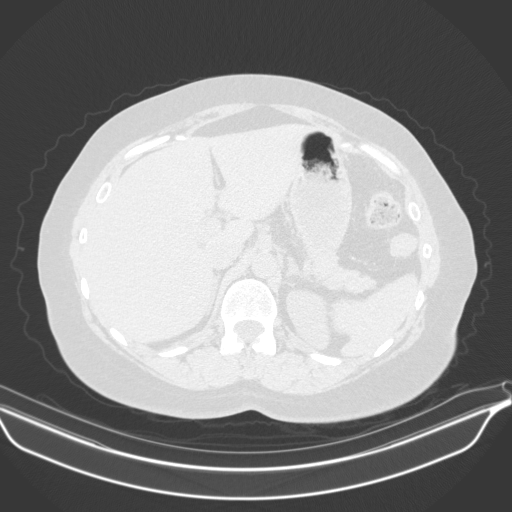
[im 25/167  lung]
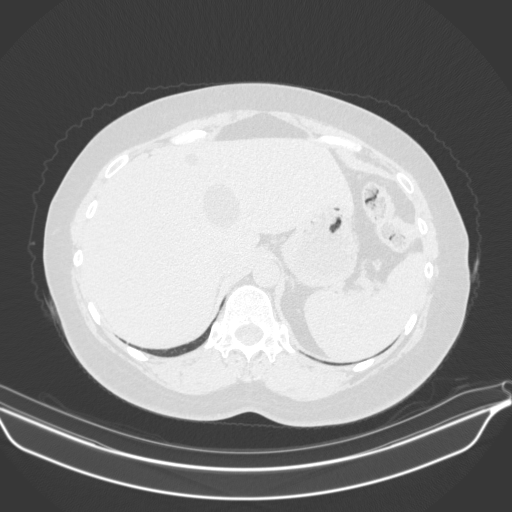
[im 34/167  lung]
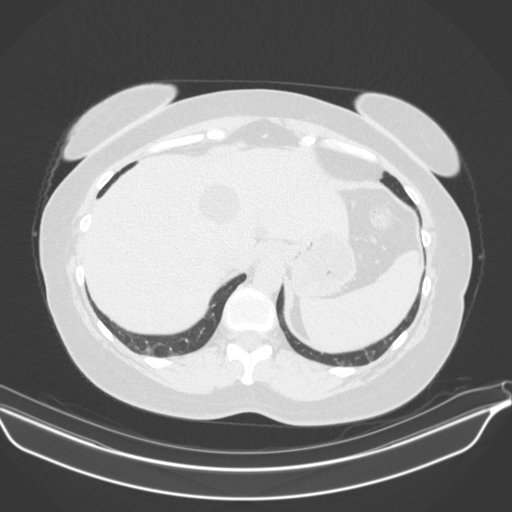
[im 44/167  lung]
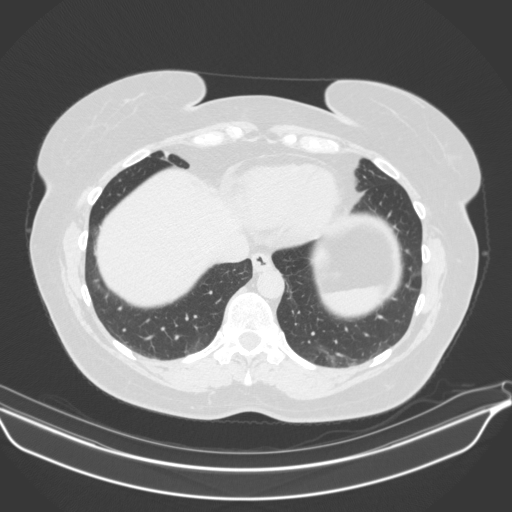
[im 56/167  mediastinal]
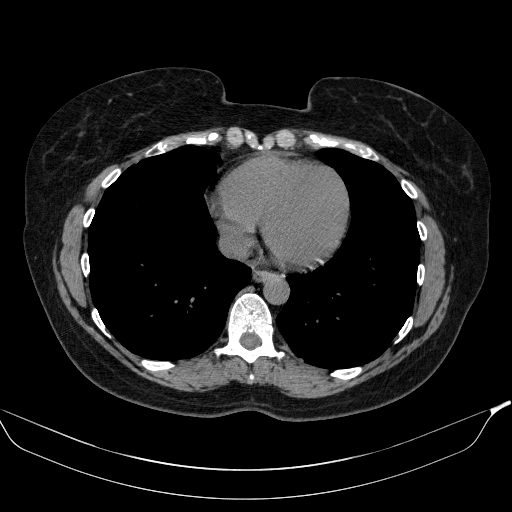
[im 56/167  lung]
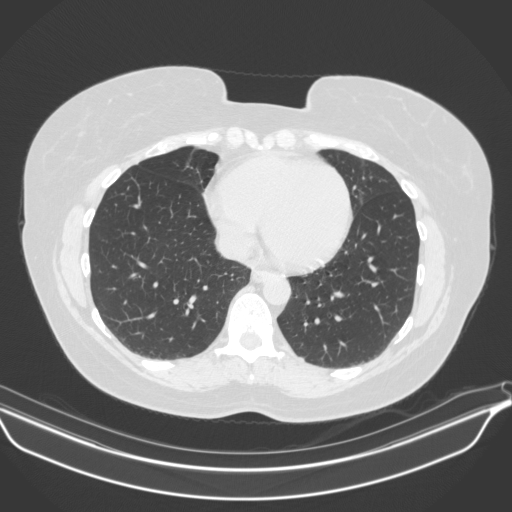
[im 67/167  lung]
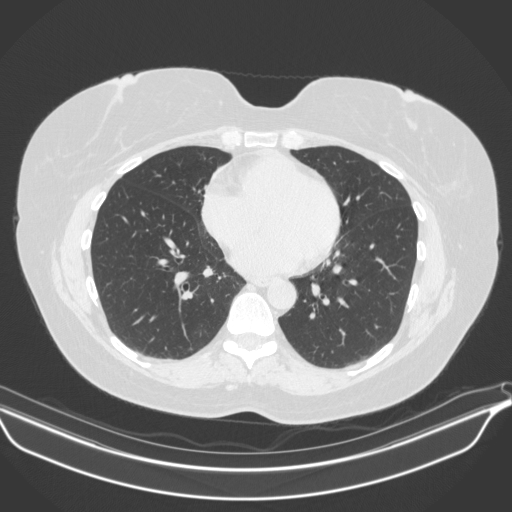
[im 74/167  lung]
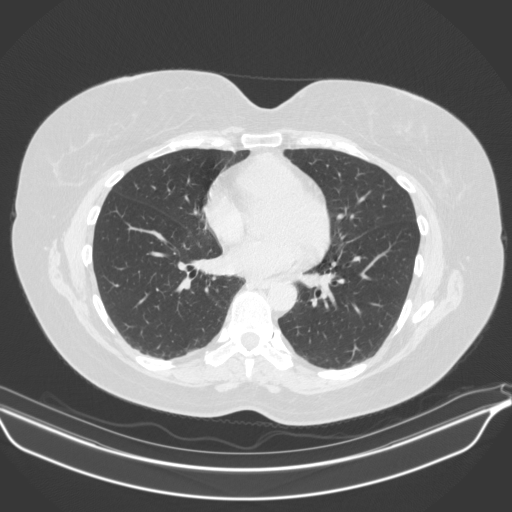
[im 87/167  lung]
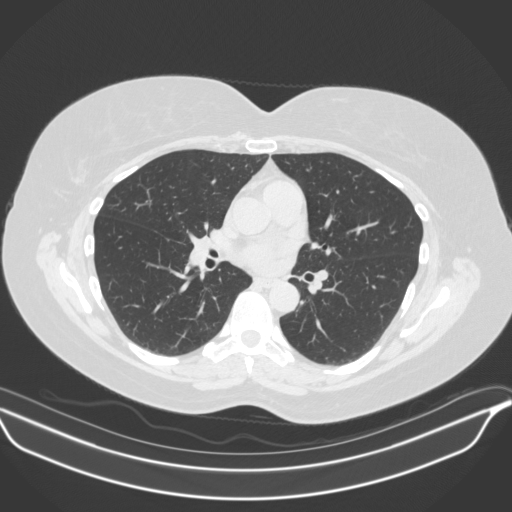
[im 93/167  mediastinal]
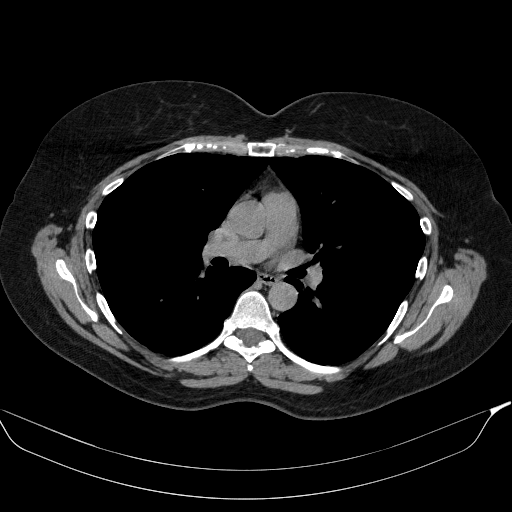
[im 93/167  lung]
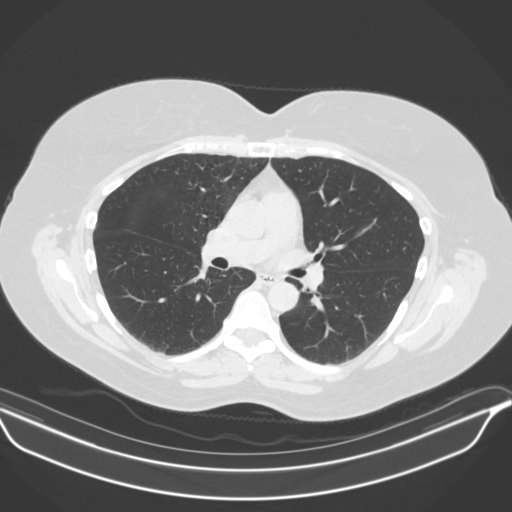
[im 100/167  lung]
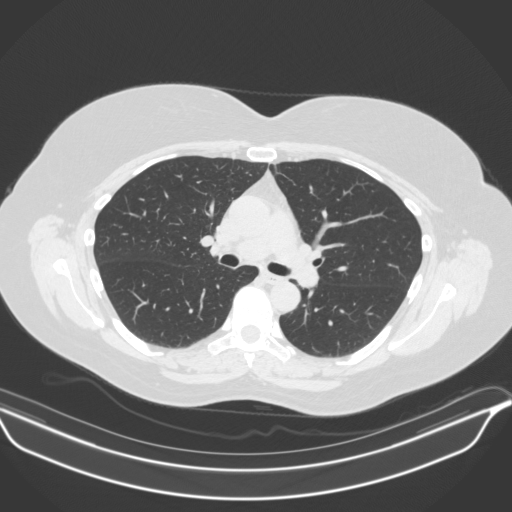
[im 111/167  lung]
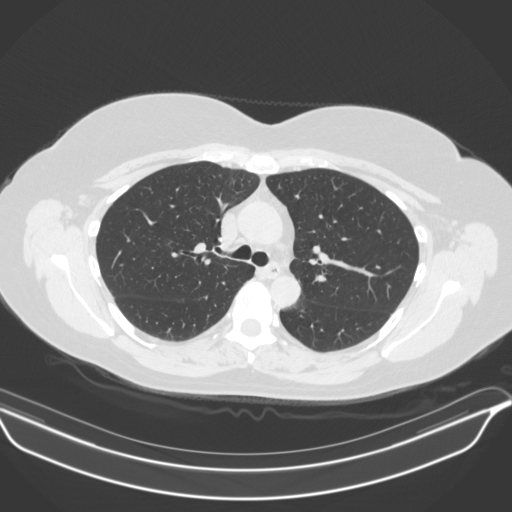
[im 123/167  lung]
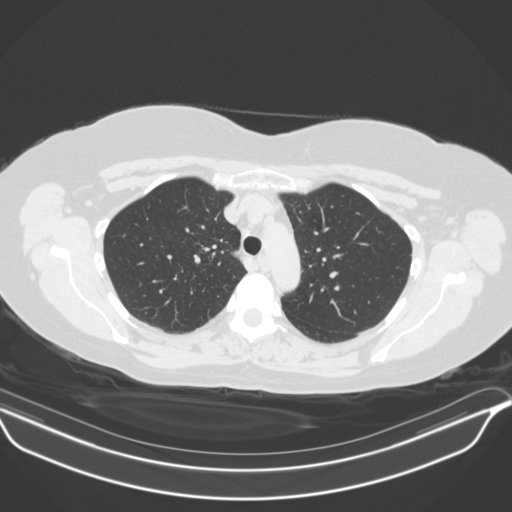
[im 133/167  mediastinal]
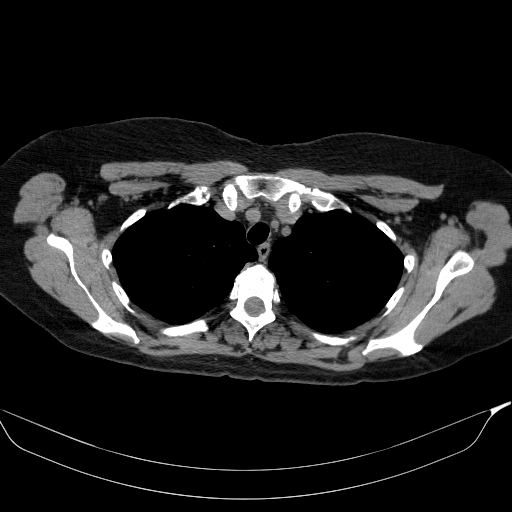
[im 133/167  lung]
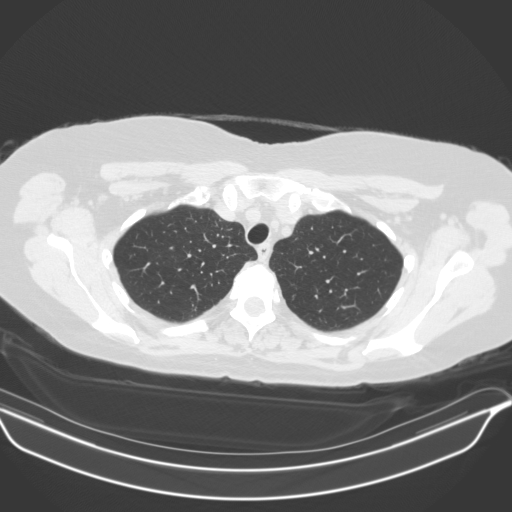
[im 142/167  lung]
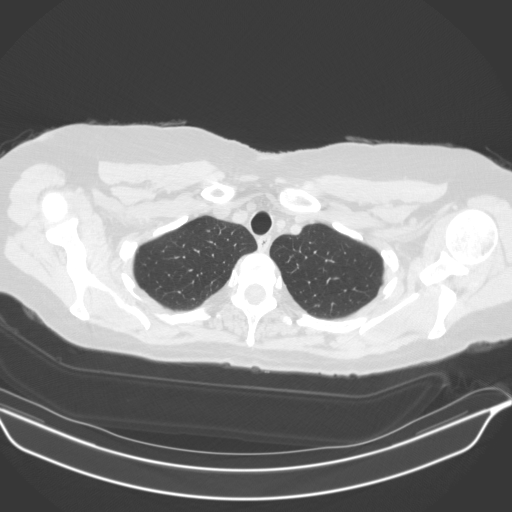
[im 154/167  lung]
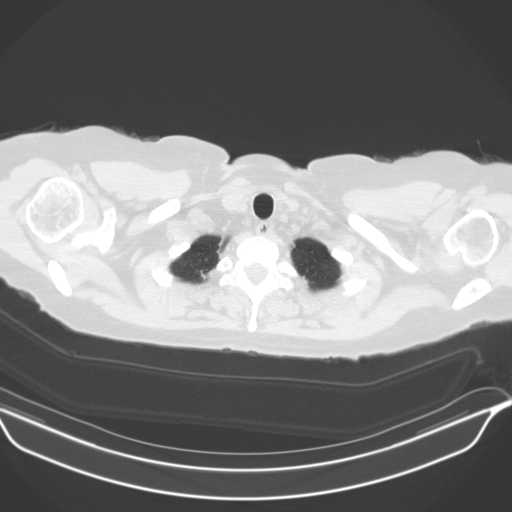

[15 of 34 positions shown; findings below may reference images not displayed]

FINDINGS: Cardiovascular: Somewhat limited by the lack of IV contrast. No
aneurysmal dilatation of the aorta is seen. No cardiac enlargement
is noted. A trace amount of pericardial fluid is seen relatively
stable from the prior exam.

Mediastinum/Nodes: No significant hilar or mediastinal adenopathy is
identified. The thoracic inlet is within normal limits. The
esophagus as visualized is unremarkable.

Lungs/Pleura: The lungs are well aerated bilaterally. Previously
seen nodular changes have resolved in the interval consistent with
postinflammatory change. No new focal nodules are seen. No focal
infiltrate or effusion is noted.

Upper Abdomen: Visualized upper abdomen again demonstrates stable
hypodensities within the liver most consistent with cysts. No other
upper abdominal abnormality is seen.

Musculoskeletal: No acute bony abnormality is noted.
IMPRESSION: Resolution of previously seen pulmonary nodules consistent with
postinflammatory change. No acute abnormality noted.
